# Patient Record
Sex: Female | Born: 1955 | Race: White | Hispanic: No | Marital: Married | Smoking: Former smoker
Health system: Southern US, Community
[De-identification: ages and names within clinical notes are randomized; demographics above are authoritative.]

## PROBLEM LIST (undated history)

## (undated) DIAGNOSIS — I10 Essential (primary) hypertension: Secondary | ICD-10-CM

## (undated) DIAGNOSIS — K219 Gastro-esophageal reflux disease without esophagitis: Secondary | ICD-10-CM

## (undated) DIAGNOSIS — F32A Depression, unspecified: Secondary | ICD-10-CM

## (undated) DIAGNOSIS — F329 Major depressive disorder, single episode, unspecified: Secondary | ICD-10-CM

## (undated) HISTORY — PX: ABLATION: SHX5711

---

## 2010-08-14 ENCOUNTER — Ambulatory Visit: Payer: 59 | Attending: Orthopedic Surgery | Admitting: Physical Therapy

## 2010-08-14 DIAGNOSIS — M542 Cervicalgia: Secondary | ICD-10-CM | POA: Insufficient documentation

## 2010-08-14 DIAGNOSIS — M546 Pain in thoracic spine: Secondary | ICD-10-CM | POA: Insufficient documentation

## 2010-08-14 DIAGNOSIS — IMO0001 Reserved for inherently not codable concepts without codable children: Secondary | ICD-10-CM | POA: Insufficient documentation

## 2010-08-14 DIAGNOSIS — R293 Abnormal posture: Secondary | ICD-10-CM | POA: Insufficient documentation

## 2010-08-17 ENCOUNTER — Ambulatory Visit: Payer: 59 | Admitting: Physical Therapy

## 2010-08-22 ENCOUNTER — Ambulatory Visit: Payer: 59 | Admitting: Physical Therapy

## 2010-08-24 ENCOUNTER — Ambulatory Visit: Payer: 59 | Attending: Orthopedic Surgery | Admitting: Physical Therapy

## 2010-08-24 DIAGNOSIS — M546 Pain in thoracic spine: Secondary | ICD-10-CM | POA: Insufficient documentation

## 2010-08-24 DIAGNOSIS — R293 Abnormal posture: Secondary | ICD-10-CM | POA: Insufficient documentation

## 2010-08-24 DIAGNOSIS — M542 Cervicalgia: Secondary | ICD-10-CM | POA: Insufficient documentation

## 2010-08-24 DIAGNOSIS — IMO0001 Reserved for inherently not codable concepts without codable children: Secondary | ICD-10-CM | POA: Insufficient documentation

## 2010-08-29 ENCOUNTER — Ambulatory Visit: Payer: 59 | Attending: Orthopedic Surgery | Admitting: Physical Therapy

## 2010-08-29 DIAGNOSIS — M546 Pain in thoracic spine: Secondary | ICD-10-CM | POA: Insufficient documentation

## 2010-08-29 DIAGNOSIS — IMO0001 Reserved for inherently not codable concepts without codable children: Secondary | ICD-10-CM | POA: Insufficient documentation

## 2010-08-29 DIAGNOSIS — R293 Abnormal posture: Secondary | ICD-10-CM | POA: Insufficient documentation

## 2010-08-29 DIAGNOSIS — M542 Cervicalgia: Secondary | ICD-10-CM | POA: Insufficient documentation

## 2010-08-31 ENCOUNTER — Encounter: Payer: 59 | Admitting: Physical Therapy

## 2014-02-24 ENCOUNTER — Emergency Department
Admission: EM | Admit: 2014-02-24 | Discharge: 2014-02-24 | Disposition: A | Discharge status: Home / Self Care | Payer: 59 | Source: Home / Self Care | Attending: Family Medicine | Admitting: Family Medicine

## 2014-02-24 ENCOUNTER — Encounter: Payer: Self-pay | Admitting: Emergency Medicine

## 2014-02-24 DIAGNOSIS — L02519 Cutaneous abscess of unspecified hand: Secondary | ICD-10-CM

## 2014-02-24 DIAGNOSIS — L03019 Cellulitis of unspecified finger: Secondary | ICD-10-CM

## 2014-02-24 DIAGNOSIS — L03011 Cellulitis of right finger: Secondary | ICD-10-CM

## 2014-02-24 HISTORY — DX: Depression, unspecified: F32.A

## 2014-02-24 HISTORY — DX: Major depressive disorder, single episode, unspecified: F32.9

## 2014-02-24 HISTORY — DX: Gastro-esophageal reflux disease without esophagitis: K21.9

## 2014-02-24 MED ORDER — SULFAMETHOXAZOLE-TRIMETHOPRIM 800-160 MG PO TABS: 2.0000 | ORAL_TABLET | Freq: Two times a day (BID) | ORAL | Status: DC

## 2014-02-24 MED ORDER — CEPHALEXIN 500 MG PO CAPS: 500.0000 mg | ORAL_CAPSULE | Freq: Four times a day (QID) | ORAL | Status: DC

## 2014-02-24 NOTE — ED Notes (Signed)
Pt c/o RT 3rd finger abscess x 2 days ago, worse today. She has applied warm compresses and neosporin with no relief.

## 2014-02-24 NOTE — ED Provider Notes (Signed)
Lindsey CarrowDebra Trujillo is a 58 y.o. female who presents to Urgent Care today for finger infection. Patient is a 2 day history of painful red bump on the dorsal aspect of her right third digit at the proximal phalanx. This started off as an itchy tiny bump that has worsened to becoming more painful and red. The redness is spreading. She denies any streaking distally or proximally. No pain with motion. No fevers or chills nausea vomiting or diarrhea. She's tried warm compresses and Neosporin which have not helped. She denies any injury.   Past Medical History  Diagnosis Date  . GERD (gastroesophageal reflux disease)   . Depression    History  Substance Use Topics  . Smoking status: Former Smoker    Quit date: 02/25/1984  . Smokeless tobacco: Not on file  . Alcohol Use: No   ROS as above Medications: No current facility-administered medications for this encounter.   Current Outpatient Prescriptions  Medication Sig Dispense Refill  . omeprazole (PRILOSEC) 20 MG capsule Take 20 mg by mouth daily.      . sertraline (ZOLOFT) 50 MG tablet Take 50 mg by mouth daily.      . cephALEXin (KEFLEX) 500 MG capsule Take 1 capsule (500 mg total) by mouth 4 (four) times daily.  28 capsule  0  . sulfamethoxazole-trimethoprim (SEPTRA DS) 800-160 MG per tablet Take 2 tablets by mouth 2 (two) times daily.  28 tablet  0    Exam:  BP 134/84  Pulse 83  Temp(Src) 98 F (36.7 C) (Oral)  Resp 16  Ht 5\' 6"  (1.676 m)  Wt 181 lb (82.101 kg)  BMI 29.23 kg/m2  SpO2 96% Gen: Well NAD Hand: Right third digit. Small tiny indurated papule at the dorsal aspect of the third digit at the proximal phalanx. Extending erythema present extending radially and ulnarly.   Motion is intact and normal. Pulses capillary Refill and sensation are intact.      No results found for this or any previous visit (from the past 24 hour(s)). No results found.  Assessment and Plan: 58 y.o. female with cellulitis. Not drainable abscess  currently. Treat with Keflex and Bactrim. Followup as needed.  Discussed warning signs or symptoms. Please see discharge instructions. Patient expresses understanding.     Rodolph BongEvan S Addysin Porco, MD 02/24/14 [phone]

## 2014-02-24 NOTE — Discharge Instructions (Signed)
Thank you for coming in today. Take both antibiotics starting this evening Return if not improving Go to the emergency room if worsening   Cellulitis Cellulitis is an infection of the skin and the tissue beneath it. The infected area is usually red and tender. Cellulitis occurs most often in the arms and lower legs.  CAUSES  Cellulitis is caused by bacteria that enter the skin through cracks or cuts in the skin. The most common types of bacteria that cause cellulitis are staphylococci and streptococci. SIGNS AND SYMPTOMS   Redness and warmth.  Swelling.  Tenderness or pain.  Fever. DIAGNOSIS  Your health care provider can usually determine what is wrong based on a physical exam. Blood tests may also be done. TREATMENT  Treatment usually involves taking an antibiotic medicine. HOME CARE INSTRUCTIONS   Take your antibiotic medicine as directed by your health care provider. Finish the antibiotic even if you start to feel better.  Keep the infected arm or leg elevated to reduce swelling.  Apply a warm cloth to the affected area up to 4 times per day to relieve pain.  Take medicines only as directed by your health care provider.  Keep all follow-up visits as directed by your health care provider. SEEK MEDICAL CARE IF:   You notice red streaks coming from the infected area.  Your red area gets larger or turns dark in color.  Your bone or joint underneath the infected area becomes painful after the skin has healed.  Your infection returns in the same area or another area.  You notice a swollen bump in the infected area.  You develop new symptoms.  You have a fever. SEEK IMMEDIATE MEDICAL CARE IF:   You feel very sleepy.  You develop vomiting or diarrhea.  You have a general ill feeling (malaise) with muscle aches and pains. MAKE SURE YOU:   Understand these instructions.  Will watch your condition.  Will get help right away if you are not doing well or get  worse. Document Released: 02/21/2005 Document Revised: 09/28/2013 Document Reviewed: 07/30/2011 [facility] Patient Information 2015 [place], [place]. This information is not intended to replace advice given to you by your health care provider. Make sure you discuss any questions you have with your health care provider.

## 2014-02-26 ENCOUNTER — Emergency Department (INDEPENDENT_AMBULATORY_CARE_PROVIDER_SITE_OTHER)
Admission: EM | Admit: 2014-02-26 | Discharge: 2014-02-26 | Disposition: A | Discharge status: Home / Self Care | Payer: 59 | Source: Home / Self Care | Attending: Emergency Medicine | Admitting: Emergency Medicine

## 2014-02-26 ENCOUNTER — Encounter: Payer: Self-pay | Admitting: Emergency Medicine

## 2014-02-26 DIAGNOSIS — L089 Local infection of the skin and subcutaneous tissue, unspecified: Secondary | ICD-10-CM

## 2014-02-26 DIAGNOSIS — S60429D Blister (nonthermal) of unspecified finger, subsequent encounter: Secondary | ICD-10-CM

## 2014-02-26 NOTE — Discharge Instructions (Signed)
Wound Infection °A wound infection happens when a type of germ (bacteria) starts growing in the wound. In some cases, this can cause the wound to break open. If cared for properly, the infected wound will heal from the inside to the outside. Wound infections need treatment. °CAUSES °An infection is caused by bacteria growing in the wound.  °SYMPTOMS  °· Increase in redness, swelling, or pain at the wound site. °· Increase in drainage at the wound site. °· Wound or bandage (dressing) starts to smell bad. °· Fever. °· Feeling tired or fatigued. °· Pus draining from the wound. °TREATMENT  °Your health care provider will prescribe antibiotic medicine. The wound infection should improve within 24 to 48 hours. Any redness around the wound should stop spreading and the wound should be less painful.  °HOME CARE INSTRUCTIONS  °· Only take over-the-counter or prescription medicines for pain, discomfort, or fever as directed by your health care provider. °· Take your antibiotics as directed. Finish them even if you start to feel better. °· Gently wash the area with mild soap and water 2 times a day, or as directed. Rinse off the soap. Pat the area dry with a clean towel. Do not rub the wound. This may cause bleeding. °· Follow your health care provider's instructions for how often you need to change the dressing. °· Apply ointment and a dressing to the wound as directed. °· If the dressing sticks, moisten it with soapy water and gently remove it. °· Change the bandage right away if it becomes wet, dirty, or develops a bad smell. °· Take showers. Do not take tub baths, swim, or do anything that may soak the wound until it is healed. °· Avoid exercises that make you sweat heavily. °· Use anti-itch medicine as directed by your health care provider. The wound may itch when it is healing. Do not pick or scratch at the wound. °· Follow up with your health care provider to get your wound rechecked as directed. °SEEK MEDICAL CARE  IF: °· You have an increase in swelling, pain, or redness around the wound. °· You have an increase in the amount of pus coming from the wound. °· There is a bad smell coming from the wound. °· More of the wound breaks open. °· You have a fever. °MAKE SURE YOU:  °· Understand these instructions. °· Will watch your condition. °· Will get help right away if you are not doing well or get worse. °Document Released: 02/10/2003 Document Revised: 05/19/2013 Document Reviewed: 09/17/2010 °ExitCare® Patient Information ©2015 [employer]. This information is not intended to replace advice given to you by your health care provider. Make sure you discuss any questions you have with your health care provider. ° °

## 2014-02-26 NOTE — ED Provider Notes (Signed)
CSN: [id number]     Arrival date & time 02/26/14  1205 History   First MD Initiated Contact with Patient 02/26/14 1231     Chief Complaint  Patient presents with  . Wound Check   (Consider location/radiation/quality/duration/timing/severity/associated sxs/prior Treatment) Patient is a 58 y.o. female presenting with wound check. The history is provided by the patient. No language interpreter was used.  Wound Check This is a new problem. The current episode started 2 days ago. The problem occurs constantly. The problem has been gradually worsening. Nothing aggravates the symptoms. Nothing relieves the symptoms. She has tried nothing for the symptoms.  Pt here for recheck of finger.  Pt reports she has increased swelling  Past Medical History  Diagnosis Date  . GERD (gastroesophageal reflux disease)   . Depression    Past Surgical History  Procedure Laterality Date  . Ablation     Family History  Problem Relation Age of Onset  . Diabetes Mother   . COPD Mother   . Emphysema Father    History  Substance Use Topics  . Smoking status: Former Smoker    Quit date: 02/25/1984  . Smokeless tobacco: Not on file  . Alcohol Use: No   OB History   Grav Para Term Preterm Abortions TAB SAB Ect Mult Living                 Review of Systems  All other systems reviewed and are negative.   Allergies  Review of patient's allergies indicates no known allergies.  Home Medications   Prior to Admission medications   Medication Sig Start Date End Date Taking? Authorizing Provider  cephALEXin (KEFLEX) 500 MG capsule Take 1 capsule (500 mg total) by mouth 4 (four) times daily. 02/24/14   Rodolph BongEvan S Corey, MD  omeprazole (PRILOSEC) 20 MG capsule Take 20 mg by mouth daily.    Historical Provider, MD  sertraline (ZOLOFT) 50 MG tablet Take 50 mg by mouth daily.    Historical Provider, MD  sulfamethoxazole-trimethoprim (SEPTRA DS) 800-160 MG per tablet Take 2 tablets by mouth 2 (two) times daily.  02/24/14   Rodolph BongEvan S Corey, MD   BP 170/94  Pulse 98  Temp(Src) 98.5 F (36.9 C) (Oral)  Resp 18  Ht 5\' 6"  (1.676 m)  Wt 180 lb (81.647 kg)  BMI 29.07 kg/m2  SpO2 96% Physical Exam  Nursing note and vitals reviewed. Constitutional: She is oriented to person, place, and time. She appears well-developed and well-nourished.  HENT:  Head: Normocephalic.  Eyes: EOM are normal.  Neck: Normal range of motion.  Abdominal: She exhibits no distension.  Musculoskeletal: She exhibits tenderness.  Swollen finger,  Redness at abscess site,  nv and ns intact  Neurological: She is alert and oriented to person, place, and time.  Psychiatric: She has a normal mood and affect.   No fluctuance or drainage ED Course  Procedures (including critical care time) Labs Review Labs Reviewed - No data to display  Imaging Review No results found.   MDM   1. Blister of finger, infected, subsequent encounter    Pt advised to elevate for next 24 hours.   Soak 4 times a day x 20 minutes,  Continue antibiotics Rechecked tomorrow   Elson AreasLeslie K Sofia, PA-C 02/26/14 1237  Medical history/examination/treatment/procedure(s) were performed by non-physician provider and as supervising physician I was immediately available for consultation/collaboration.  Lajean Manesavid Massey, MD 02/27/14 [phone]

## 2014-02-26 NOTE — ED Notes (Signed)
Wound recheck.  Seen here 2 days ago.  Patient states the swelling to finger is worse and more painful.

## 2014-02-27 ENCOUNTER — Emergency Department (INDEPENDENT_AMBULATORY_CARE_PROVIDER_SITE_OTHER)
Admission: EM | Admit: 2014-02-27 | Discharge: 2014-02-27 | Disposition: A | Discharge status: Home / Self Care | Payer: 59 | Source: Home / Self Care | Attending: Emergency Medicine | Admitting: Emergency Medicine

## 2014-02-27 ENCOUNTER — Encounter: Payer: Self-pay | Admitting: Emergency Medicine

## 2014-02-27 DIAGNOSIS — L02511 Cutaneous abscess of right hand: Secondary | ICD-10-CM

## 2014-02-27 DIAGNOSIS — IMO0001 Reserved for inherently not codable concepts without codable children: Secondary | ICD-10-CM

## 2014-02-27 MED ORDER — HYDROCODONE-ACETAMINOPHEN 5-325 MG PO TABS: 1.0000 | ORAL_TABLET | ORAL | Status: DC | PRN

## 2014-02-27 MED ORDER — DOXYCYCLINE HYCLATE 100 MG PO CAPS: 100.0000 mg | ORAL_CAPSULE | Freq: Two times a day (BID) | ORAL | Status: DC

## 2014-02-27 MED ORDER — CEFTRIAXONE SODIUM 1 G IJ SOLR
1.0000 g | INTRAMUSCULAR | Status: AC
  Administered 2014-02-27: 1 g via INTRAMUSCULAR

## 2014-02-27 NOTE — ED Provider Notes (Signed)
CSN: [id number]     Arrival date & time 02/27/14  0902 History   First MD Initiated Contact with Patient 02/27/14 [id number]     Chief Complaint  Patient presents with  . Hand Pain    HPI Here for follow-up soft tissue infection R middle finger. Seen here by Dr Denyse Amassorey on Wed. 9/30 for first time, for cellulitis, without any drainable lesion, treated appropriately with Keflex and Septra. Followed up here yesterday, seen by another provider, again without any drainable lesion, and treatment was continued with the addition of warm soaks. Patient presents today with 1 day of worsening swelling and pain and redness right third finger. Pain is sharp and dull and throbbing, 8/10 at rest and 10 of 10 to palpation. No definite fever or chills. No drainage. Denies numbness or weakness.  Past Medical History  Diagnosis Date  . GERD (gastroesophageal reflux disease)   . Depression    Past Surgical History  Procedure Laterality Date  . Ablation     Family History  Problem Relation Age of Onset  . Diabetes Mother   . COPD Mother   . Emphysema Father    History  Substance Use Topics  . Smoking status: Former Smoker    Quit date: 02/25/1984  . Smokeless tobacco: Not on file  . Alcohol Use: No   OB History   Grav Para Term Preterm Abortions TAB SAB Ect Mult Living                 Review of Systems  All other systems reviewed and are negative.   Allergies  Review of patient's allergies indicates no known allergies.  Home Medications   Prior to Admission medications   Medication Sig Start Date End Date Taking? Authorizing Provider  cephALEXin (KEFLEX) 500 MG capsule Take 1 capsule (500 mg total) by mouth 4 (four) times daily. 02/24/14   Rodolph BongEvan S Corey, MD  doxycycline (VIBRAMYCIN) 100 MG capsule Take 1 capsule (100 mg total) by mouth 2 (two) times daily. For 10 days 02/27/14   Lajean Manesavid Massey, MD  HYDROcodone-acetaminophen (NORCO/VICODIN) 5-325 MG per tablet Take 1-2 tablets by mouth every 4  (four) hours as needed for severe pain. Take with food. 02/27/14   Lajean Manesavid Massey, MD  omeprazole (PRILOSEC) 20 MG capsule Take 20 mg by mouth daily.    Historical Provider, MD  sertraline (ZOLOFT) 50 MG tablet Take 50 mg by mouth daily.    Historical Provider, MD   BP 143/97  Pulse 98  Temp(Src) 98.3 F (36.8 C) (Oral)  Ht 5\' 6"  (1.676 m)  Wt 178 lb 4 oz (80.854 kg)  BMI 28.78 kg/m2  SpO2 99% Physical Exam  Nursing note and vitals reviewed. Constitutional: She is oriented to person, place, and time. She appears well-developed and well-nourished. No distress.  Pleasant, cooperative female. Very uncomfortable from right third finger pain.  HENT:  Head: Normocephalic and atraumatic.  Eyes: Conjunctivae and EOM are normal. Pupils are equal, round, and reactive to light. No scleral icterus.  Neck: Normal range of motion.  Cardiovascular: Normal rate.   Pulmonary/Chest: Effort normal.  Abdominal: She exhibits no distension.  Musculoskeletal: Normal range of motion.       Right hand: She exhibits swelling. She exhibits normal capillary refill. Normal sensation noted. Normal strength noted.       Hands: As depicted, on dorsum of R third finger, proximal phalanx, 6 x 6 mm area of fluctuance in center surrounded by 15 x 15 mm area of  redness and heat and exquisite tenderness.  Neurological: She is alert and oriented to person, place, and time.  Skin: Skin is warm.  Psychiatric: She has a normal mood and affect.    ED Course  INCISION AND DRAINAGE Date/Time: 02/27/2014 9:16 AM Performed by: Georgina Pillion, DAVID Authorized by: Georgina Pillion, DAVID Consent: Verbal consent obtained. Risks and benefits: risks, benefits and alternatives were discussed Consent given by: patient Patient understanding: patient states understanding of the procedure being performed Patient identity confirmed: verbally with patient Time out: Immediately prior to procedure a "time out" was called to verify the correct patient,  procedure, equipment, support staff and site/side marked as required. Type: abscess Location: right third finger. Anesthesia method: after options discussed, including local or digital block, patient declined anesthesia. Scalpel size: 11 Incision type: single straight Complexity: simple Drainage: purulent and  serosanguinous Drainage amount: moderate Wound treatment: wound left open Patient tolerance: Patient tolerated the procedure well with no immediate complications. Comments: Sterile technique, prepped in the usual fashion. I&D with # 11 blade, pus sent for culture,and almost immediately, she reported much less pain right third finger. Dressed with nonstick dressing and cause. Wound care discussed in AVS. Advised to follow-up tomorrow for recheck.  postprocedure, range of motion and tendons were tested and were intact. Labs Review Labs Reviewed  WOUND CULTURE    Imaging Review No results found.   MDM   1. Abscess of third finger of right hand    Incision and drainage as described above. Wound culture sent. Rocephin 1 g IM stat Discontinue Septra, and instead start doxycycline for staph coverage. Continue cephalexin. Vicodin prescribed if needed for severe postop pain. Follow-up: Tomorrow to recheck. She understands that wound culture may take two days for results. See AVS for further details.  Precautions discussed. Red flags discussed. Questions invited and answered. Patient voiced understanding and agreement.     Lajean Manes, MD 02/27/14 2000

## 2014-02-27 NOTE — Discharge Instructions (Signed)
Abscess Care After An abscess (also called a boil or furuncle) is an infected area that contains a collection of pus. Signs and symptoms of an abscess include pain, tenderness, redness, or hardness, or you may feel a moveable soft area under your skin. An abscess can occur anywhere in the body. The infection may spread to surrounding tissues causing cellulitis. A cut (incision) by the surgeon was made over your abscess and the pus was drained out. Gauze may have been packed into the space to provide a drain that will allow the cavity to heal from the inside outwards. The boil may be painful for 5 to 7 days. Most people with a boil do not have high fevers. HOME CARE INSTRUCTIONS   Only take over-the-counter or prescription medicines for pain, discomfort, or fever as directed by your caregiver.--Tylenol or ibuprofen for mild to moderate pain. Prescription for Vicodin if needed for severe pain.  Soak warm water and then remove gauze 3 times a day . You may then wash the wound gently with mild soapy water. Re -dress with gauze or do as your caregiver directs. SEEK IMMEDIATE MEDICAL CARE IF:   You develop increased pain, swelling, redness, drainage, or bleeding in the wound site.  You develop signs of generalized infection including muscle aches, chills, fever, or a general ill feeling.  An oral temperature above 102 F (38.9 C) develops, not controlled by medication. See your caregiver for a recheck if you develop any of the symptoms described above. If medications (antibiotics) were prescribed, take them as directed.- Stop the Septra (sulfamethoxazole trimethoprim).  Continue the cephalexin antibiotic. Add a new antibiotic: Doxycycline twice a day.  Today, we also gave you a shot of Rocephin which is a strong antibiotic.  Today,we obtained a swab of the wound, sending off for culture, to try to identify the bacteria causing the infection. This may take 2 days to get the results.  Keep right  hand elevated, and don't do any work with right hand until rechecked tomorrow.

## 2014-02-27 NOTE — ED Notes (Signed)
Lesion on R middle finger. Seen on Wed. 9/30 for first time with a f/u 10/2

## 2014-02-28 ENCOUNTER — Encounter: Payer: Self-pay | Admitting: Emergency Medicine

## 2014-02-28 ENCOUNTER — Emergency Department (INDEPENDENT_AMBULATORY_CARE_PROVIDER_SITE_OTHER)
Admission: EM | Admit: 2014-02-28 | Discharge: 2014-02-28 | Disposition: A | Discharge status: Home / Self Care | Payer: 59 | Source: Home / Self Care | Attending: Family Medicine | Admitting: Family Medicine

## 2014-02-28 DIAGNOSIS — L02511 Cutaneous abscess of right hand: Secondary | ICD-10-CM

## 2014-02-28 NOTE — ED Notes (Signed)
In for recheck of lesion on R middle finger.

## 2014-02-28 NOTE — ED Provider Notes (Signed)
Lindsey Trujillo is a 58 y.o. female who presents to Urgent Care today for followup of cellulitis. Patient was seen several days ago for an infection or abscess involving the right third digit. She was started on Bactrim and Keflex. The wound worsened and she followed up yesterday and had an incision and drainage as well as abscess. Her Bactrim was changed to doxycycline and she was given no muscular injection of ceftriaxone. She continues to take Keflex and doxycycline currently. She has the pain is much less than it was yesterday. No fevers or chills nausea vomiting or diarrhea.   Past Medical History  Diagnosis Date  . GERD (gastroesophageal reflux disease)   . Depression    History  Substance Use Topics  . Smoking status: Former Smoker    Quit date: 02/25/1984  . Smokeless tobacco: Not on file  . Alcohol Use: No   ROS as above Medications: No current facility-administered medications for this encounter.   Current Outpatient Prescriptions  Medication Sig Dispense Refill  . cephALEXin (KEFLEX) 500 MG capsule Take 1 capsule (500 mg total) by mouth 4 (four) times daily.  28 capsule  0  . doxycycline (VIBRAMYCIN) 100 MG capsule Take 1 capsule (100 mg total) by mouth 2 (two) times daily. For 10 days  20 capsule  0  . HYDROcodone-acetaminophen (NORCO/VICODIN) 5-325 MG per tablet Take 1-2 tablets by mouth every 4 (four) hours as needed for severe pain. Take with food.  12 tablet  0  . omeprazole (PRILOSEC) 20 MG capsule Take 20 mg by mouth daily.      . sertraline (ZOLOFT) 50 MG tablet Take 50 mg by mouth daily.        Exam:  BP 133/80  Pulse 102  Temp(Src) 97.4 F (36.3 C) (Oral)  Ht 5\' 6"  (1.676 m)  Wt 179 lb 12 oz (81.534 kg)  BMI 29.03 kg/m2  SpO2 99% Gen: Well NAD Skin:  Localized 1 cm area of erythema with mild tenderness. No expressible pus. No extending erythema. Hand motion is normal and intact. Pulses capillary refill and sensation are intact     No results found for  this or any previous visit (from the past 24 hour(s)). No results found.  Assessment and Plan: 58 y.o. female with cellulitis and abscess. Seems to be improved compared to yesterday. Continue antibiotics. Continue warm compresses and soaks. Return to clinic in 2 days for wound recheck. Return sooner if needed.  Discussed warning signs or symptoms. Please see discharge instructions. Patient expresses understanding.     Rodolph BongEvan S Angenette Daily, MD 02/28/14 [phone]

## 2014-02-28 NOTE — Discharge Instructions (Signed)
Thank you for coming in today. Continue keflex and doxycycline.  Come back in 2 days for wound recheck.    Cellulitis Cellulitis is an infection of the skin and the tissue beneath it. The infected area is usually red and tender. Cellulitis occurs most often in the arms and lower legs.  CAUSES  Cellulitis is caused by bacteria that enter the skin through cracks or cuts in the skin. The most common types of bacteria that cause cellulitis are staphylococci and streptococci. SIGNS AND SYMPTOMS   Redness and warmth.  Swelling.  Tenderness or pain.  Fever. DIAGNOSIS  Your health care provider can usually determine what is wrong based on a physical exam. Blood tests may also be done. TREATMENT  Treatment usually involves taking an antibiotic medicine. HOME CARE INSTRUCTIONS   Take your antibiotic medicine as directed by your health care provider. Finish the antibiotic even if you start to feel better.  Keep the infected arm or leg elevated to reduce swelling.  Apply a warm cloth to the affected area up to 4 times per day to relieve pain.  Take medicines only as directed by your health care provider.  Keep all follow-up visits as directed by your health care provider. SEEK MEDICAL CARE IF:   You notice red streaks coming from the infected area.  Your red area gets larger or turns dark in color.  Your bone or joint underneath the infected area becomes painful after the skin has healed.  Your infection returns in the same area or another area.  You notice a swollen bump in the infected area.  You develop new symptoms.  You have a fever. SEEK IMMEDIATE MEDICAL CARE IF:   You feel very sleepy.  You develop vomiting or diarrhea.  You have a general ill feeling (malaise) with muscle aches and pains. MAKE SURE YOU:   Understand these instructions.  Will watch your condition.  Will get help right away if you are not doing well or get worse. Document Released: 02/21/2005  Document Revised: 09/28/2013 Document Reviewed: 07/30/2011 [facility] Patient Information 2015 [place]Care, [place]LL[place]. This information is not intended to replace advice given to you by your health care provider. Make sure you discuss any questions you have with your health care provider.

## 2014-03-02 ENCOUNTER — Encounter: Payer: Self-pay | Admitting: Emergency Medicine

## 2014-03-02 ENCOUNTER — Emergency Department (INDEPENDENT_AMBULATORY_CARE_PROVIDER_SITE_OTHER)
Admission: EM | Admit: 2014-03-02 | Discharge: 2014-03-02 | Disposition: A | Discharge status: Home / Self Care | Payer: 59 | Source: Home / Self Care

## 2014-03-02 ENCOUNTER — Telehealth: Payer: Self-pay | Admitting: Emergency Medicine

## 2014-03-02 DIAGNOSIS — L02511 Cutaneous abscess of right hand: Secondary | ICD-10-CM

## 2014-03-02 LAB — WOUND CULTURE
Gram Stain: NONE SEEN
Gram Stain: NONE SEEN
Gram Stain: NONE SEEN

## 2014-03-02 NOTE — ED Provider Notes (Signed)
CSN: [id number]     Arrival date & time 03/02/14  0806 History   None    Chief Complaint  Patient presents with  . Wound Check   (Consider location/radiation/quality/duration/timing/severity/associated sxs/prior Treatment) HPI Pt presents to the clinic for wound recheck. She has been treating what first was a bump on hand that turned into abcess. She is currently on doxycycline and keflex. There is little to no drainage present today. There is also little to no pain as well. No fever, chill, n/v. She is ready to go back to work she feels like she can type again. She is keeping wound covered.   Past Medical History  Diagnosis Date  . GERD (gastroesophageal reflux disease)   . Depression    Past Surgical History  Procedure Laterality Date  . Ablation     Family History  Problem Relation Age of Onset  . Diabetes Mother   . COPD Mother   . Emphysema Father    History  Substance Use Topics  . Smoking status: Former Smoker    Quit date: 02/25/1984  . Smokeless tobacco: Not on file  . Alcohol Use: No   OB History   Grav Para Term Preterm Abortions TAB SAB Ect Mult Living                 Review of Systems  All other systems reviewed and are negative.   Allergies  Review of patient's allergies indicates no known allergies.  Home Medications   Prior to Admission medications   Medication Sig Start Date End Date Taking? Authorizing Provider  cephALEXin (KEFLEX) 500 MG capsule Take 1 capsule (500 mg total) by mouth 4 (four) times daily. 02/24/14   Rodolph BongEvan S Corey, MD  doxycycline (VIBRAMYCIN) 100 MG capsule Take 1 capsule (100 mg total) by mouth 2 (two) times daily. For 10 days 02/27/14   Lajean Manesavid Massey, MD  HYDROcodone-acetaminophen (NORCO/VICODIN) 5-325 MG per tablet Take 1-2 tablets by mouth every 4 (four) hours as needed for severe pain. Take with food. 02/27/14   Lajean Manesavid Massey, MD  omeprazole (PRILOSEC) 20 MG capsule Take 20 mg by mouth daily.    Historical Provider, MD   sertraline (ZOLOFT) 50 MG tablet Take 50 mg by mouth daily.    Historical Provider, MD   BP 151/85  Pulse 85  Temp(Src) 98.7 F (37.1 C) (Oral)  Resp 16  SpO2 94% Physical Exam  Skin:  Right middle finger abscess incision is well healed with no active drainage. There continues to be approximately 2.5cm by 2.25cm of erythema total diameter around the abcess. No tenderness. Pt does not feel it has increased since last visit.     ED Course  Procedures (including critical care time) Labs Review Labs Reviewed - No data to display  Imaging Review No results found.   MDM   1. Abscess of finger of right hand    Wound seems to be healing and patient feeling better.  Finish doxycycline and keflex.  Still waiting for culture results.  Pt has no pain.  Concerned redness is not going down but pt admits to just finishing warm compress. Discussed she could stop that today and since wound closed and not draining consider cool compresses for erythema.  No swelling noted. Pt feels like she can type and do her job.  Allowed her to go back to work as long as she keep wound covered.  Discussed to measure same points as I measured today for next week. If redness increasing  then needs to call or come back in for follow up.     Jomarie Longs, PA-C 03/02/14 [phone]

## 2014-03-02 NOTE — ED Notes (Signed)
New dressing applied using non stick gauze pad secured with 1 inch coban.

## 2014-03-02 NOTE — ED Notes (Signed)
Wound recheck.  Patient states that she feels better.  Did not take pain medication this AM. Reports pain level 1/10.

## 2014-03-02 NOTE — ED Notes (Signed)
Here for wound recheck right middle finger.   

## 2014-03-18 NOTE — ED Provider Notes (Signed)
Agree with exam, assessment, and plan.   Stephen A Beese, MD 03/18/14 1832 

## 2014-10-05 DIAGNOSIS — I10 Essential (primary) hypertension: Secondary | ICD-10-CM | POA: Insufficient documentation

## 2015-02-19 ENCOUNTER — Encounter: Payer: Self-pay | Admitting: Emergency Medicine

## 2015-02-19 ENCOUNTER — Emergency Department
Admission: EM | Admit: 2015-02-19 | Discharge: 2015-02-19 | Disposition: A | Discharge status: Home / Self Care | Payer: 59 | Source: Home / Self Care | Attending: Family Medicine | Admitting: Family Medicine

## 2015-02-19 DIAGNOSIS — R05 Cough: Secondary | ICD-10-CM | POA: Diagnosis not present

## 2015-02-19 DIAGNOSIS — R0981 Nasal congestion: Secondary | ICD-10-CM | POA: Diagnosis not present

## 2015-02-19 DIAGNOSIS — R059 Cough, unspecified: Secondary | ICD-10-CM

## 2015-02-19 DIAGNOSIS — J029 Acute pharyngitis, unspecified: Secondary | ICD-10-CM

## 2015-02-19 MED ORDER — SALINE SPRAY 0.65 % NA SOLN: 1.0000 | NASAL | Status: DC | PRN

## 2015-02-19 MED ORDER — BENZONATATE 100 MG PO CAPS: 100.0000 mg | ORAL_CAPSULE | Freq: Three times a day (TID) | ORAL | Status: DC

## 2015-02-19 MED ORDER — CETIRIZINE HCL 10 MG PO TABS: 10.0000 mg | ORAL_TABLET | Freq: Every day | ORAL | Status: DC

## 2015-02-19 MED ORDER — DM-GUAIFENESIN ER 30-600 MG PO TB12: 1.0000 | ORAL_TABLET | Freq: Two times a day (BID) | ORAL | Status: DC

## 2015-02-19 NOTE — ED Provider Notes (Signed)
CSN: [id number]     Arrival date & time 02/19/15  0906 History   First MD Initiated Contact with Patient 02/19/15 [phone]     Chief Complaint  Patient presents with  . Cough   (Consider location/radiation/quality/duration/timing/severity/associated sxs/prior Treatment) HPI  Pt is a 59yo female presenting to [facility] with c/o mild intermittent dry to productive cough with small amount of yellow sputum for 5 days, associated sneezing with nasal congestion and sore throat.  She has been using Flonase which she has been using daily for several years.  She reports her granddaughter came over last week and has a "hacking cough" but pt believed it was from allergies, her granddaughter now as a URI.  Pt denies chest pain, SOB, n/v/d. Denies recent travel. She has not taken any cough medications.  Denies hx of asthma. Denies leg pain or swelling.  Past Medical History  Diagnosis Date  . GERD (gastroesophageal reflux disease)   . Depression    Past Surgical History  Procedure Laterality Date  . Ablation     Family History  Problem Relation Age of Onset  . Diabetes Mother   . COPD Mother   . Emphysema Father    Social History  Substance Use Topics  . Smoking status: Former Smoker    Quit date: 02/25/1984  . Smokeless tobacco: None  . Alcohol Use: No   OB History    No data available     Review of Systems  Constitutional: Negative for fever and chills.  HENT: Positive for congestion, sneezing and sore throat. Negative for ear pain, trouble swallowing and voice change.   Respiratory: Positive for cough. Negative for shortness of breath.   Cardiovascular: Negative for chest pain and palpitations.  Gastrointestinal: Negative for nausea, vomiting, abdominal pain and diarrhea.  Musculoskeletal: Negative for myalgias, back pain and arthralgias.  Skin: Negative for rash.  All other systems reviewed and are negative.   Allergies  Review of patient's allergies indicates no known  allergies.  Home Medications   Prior to Admission medications   Medication Sig Start Date End Date Taking? Authorizing Provider  benzonatate (TESSALON) 100 MG capsule Take 1 capsule (100 mg total) by mouth every 8 (eight) hours. 02/19/15   Junius Finner, PA-C  cephALEXin (KEFLEX) 500 MG capsule Take 1 capsule (500 mg total) by mouth 4 (four) times daily. 02/24/14   Rodolph Bong, MD  cetirizine (ZYRTEC) 10 MG tablet Take 1 tablet (10 mg total) by mouth daily. 02/19/15   Junius Finner, PA-C  dextromethorphan-guaiFENesin (MUCINEX DM) 30-600 MG per 12 hr tablet Take 1 tablet by mouth 2 (two) times daily. With glass of water 02/19/15   Junius Finner, PA-C  doxycycline (VIBRAMYCIN) 100 MG capsule Take 1 capsule (100 mg total) by mouth 2 (two) times daily. For 10 days 02/27/14   Lajean Manes, MD  HYDROcodone-acetaminophen (NORCO/VICODIN) 5-325 MG per tablet Take 1-2 tablets by mouth every 4 (four) hours as needed for severe pain. Take with food. 02/27/14   Lajean Manes, MD  omeprazole (PRILOSEC) 20 MG capsule Take 20 mg by mouth daily.    Historical Provider, MD  sertraline (ZOLOFT) 50 MG tablet Take 50 mg by mouth daily.    Historical Provider, MD  sodium chloride (OCEAN) 0.65 % SOLN nasal spray Place 1 spray into both nostrils as needed. 02/19/15   Junius Finner, PA-C   Meds Ordered and Administered this Visit  Medications - No data to display  BP 121/81 mmHg  Pulse 100  Temp(Src)  98.4 F (36.9 C) (Oral)  Resp 16  Ht  (1.651 m)  Wt 179 lb (81.194 kg)  BMI 29.79 kg/m2  SpO2 96% No data found.   Physical Exam  Constitutional: She appears well-developed and well-nourished. No distress.  HENT:  Head: Normocephalic and atraumatic.  Right Ear: Hearing, tympanic membrane, external ear and ear canal normal.  Left Ear: Hearing, tympanic membrane, external ear and ear canal normal.  Nose: Mucosal edema present. Epistaxis (dried red blood in Right nare) is observed. Right sinus exhibits no  maxillary sinus tenderness and no frontal sinus tenderness. Left sinus exhibits no maxillary sinus tenderness and no frontal sinus tenderness.  Mouth/Throat: Uvula is midline, oropharynx is clear and moist and mucous membranes are normal.  Eyes: Conjunctivae are normal. No scleral icterus.  Neck: Normal range of motion. Neck supple.  Cardiovascular: Regular rhythm and normal heart sounds.  Tachycardia present.   mild  Pulmonary/Chest: Effort normal and breath sounds normal. No respiratory distress. She has no wheezes. She has no rales. She exhibits no tenderness.  Abdominal: Soft. She exhibits no distension and no mass. There is no tenderness. There is no rebound and no guarding.  Musculoskeletal: Normal range of motion.  Neurological: She is alert.  Skin: Skin is warm and dry. She is not diaphoretic.  Nursing note and vitals reviewed.   ED Course  Procedures (including critical care time)  Labs Review Labs Reviewed - No data to display  Imaging Review No results found.   MDM   1. Cough   2. Nasal congestion   3. Sore throat     Pt c/o cough, congestion, and sore throat. Contact with granddaughter with similar symptoms 1 week ago. Pt appears well, non-toxic, no respiratory distress. HR slightly elevated at 112 in triage, improved to 100 upon recheck.  Pt has no risk factors for DVT. Doubt PE at this time. Symptoms and hx most c/w URI. Discussed CXR or conservative treatment, pt agreed to conservative treatment and will f/u with PCP if not improving. Rx: tessalon, mucinex DM, nasal saline (pt has hx of recurrent nosebleeds, has been seen by ENT for same, Right nare appeared dried and irritated today), and cetirizine. Home care instructions provide.   F/u with PCP in 4-5 days if not improving, sooner if worsening. Patient verbalized understanding and agreement with treatment plan.     Junius Finner, PA-C 02/19/15 [phone]

## 2015-02-19 NOTE — ED Notes (Signed)
Patient present to [facility] with C/O dry cough with increase sneezing times five days. She states productive at times with small amount of yellow sputum. Denies pain, fever or SOB. NAD noted at this time

## 2015-02-19 NOTE — ED Notes (Signed)
Discharge with instructions patient verbalizes understanding.

## 2015-06-05 ENCOUNTER — Encounter: Payer: Self-pay | Admitting: Emergency Medicine

## 2015-06-05 ENCOUNTER — Emergency Department
Admission: EM | Admit: 2015-06-05 | Discharge: 2015-06-05 | Disposition: A | Discharge status: Home / Self Care | Payer: 59 | Source: Home / Self Care | Attending: Family Medicine | Admitting: Family Medicine

## 2015-06-05 DIAGNOSIS — B9789 Other viral agents as the cause of diseases classified elsewhere: Principal | ICD-10-CM

## 2015-06-05 DIAGNOSIS — J069 Acute upper respiratory infection, unspecified: Secondary | ICD-10-CM

## 2015-06-05 MED ORDER — DOXYCYCLINE HYCLATE 100 MG PO CAPS: 100.0000 mg | ORAL_CAPSULE | Freq: Two times a day (BID) | ORAL | Status: DC

## 2015-06-05 MED ORDER — GUAIFENESIN-CODEINE 100-10 MG/5ML PO SOLN: ORAL | Status: DC

## 2015-06-05 NOTE — ED Provider Notes (Signed)
CSN: [id number]     Arrival date & time 06/05/15  1540 History   First MD Initiated Contact with Patient 06/05/15 1718     Chief Complaint  Patient presents with  . Cough      HPI Comments: Patient complains of four day history of typical cold-like symptoms including mild sore throat, sinus congestion, headache, fatigue, and cough.  No fevers, chills, and sweats   The history is provided by the patient.    Past Medical History  Diagnosis Date  . GERD (gastroesophageal reflux disease)   . Depression    Past Surgical History  Procedure Laterality Date  . Ablation     Family History  Problem Relation Age of Onset  . Diabetes Mother   . COPD Mother   . Emphysema Father    Social History  Substance Use Topics  . Smoking status: Former Smoker    Quit date: 02/25/1984  . Smokeless tobacco: None  . Alcohol Use: No   OB History    No data available     Review of Systems + sore throat + cough No pleuritic pain No wheezing + nasal congestion + post-nasal drainage No sinus pain/pressure No itchy/red eyes No earache No hemoptysis No SOB No fever/chills No nausea No vomiting No abdominal pain No diarrhea No urinary symptoms No skin rash + fatigue No myalgias + headache Used OTC meds without relief  Allergies  Review of patient's allergies indicates no known allergies.  Home Medications   Prior to Admission medications   Medication Sig Start Date End Date Taking? Authorizing Provider  Escitalopram Oxalate (LEXAPRO PO) Take by mouth.   Yes Historical Provider, MD  RaNITidine HCl (ZANTAC PO) Take by mouth.   Yes Historical Provider, MD  doxycycline (VIBRAMYCIN) 100 MG capsule Take 1 capsule (100 mg total) by mouth 2 (two) times daily. Take with food (Rx void after 06/13/15) 06/05/15   Lattie HawStephen A Beese, MD  guaiFENesin-codeine 100-10 MG/5ML syrup Take 10mL by mouth at bedtime as needed for cough 06/05/15   Lattie HawStephen A Beese, MD   Meds Ordered and Administered this Visit   Medications - No data to display  BP 119/75 mmHg  Temp(Src) 98.1 F (36.7 C) (Oral)  Ht 5\' 5"  (1.651 m)  Wt 183 lb 4 oz (83.122 kg)  BMI 30.49 kg/m2  SpO2 96% No data found.   Physical Exam Nursing notes and Vital Signs reviewed. Appearance:  Patient appears stated age, and in no acute distress.  Patient is obese (BMI 30.5) Eyes:  Pupils are equal, round, and reactive to light and accomodation.  Extraocular movement is intact.  Conjunctivae are not inflamed  Ears:  Canals normal.  Tympanic membranes normal.  Nose:  Mildly congested turbinates.  No sinus tenderness.  Pharynx:  Normal Neck:  Supple.  Tender enlarged posterior nodes are palpated bilaterally  Lungs:  Clear to auscultation.  Breath sounds are equal.  Moving air well. Heart:  Regular rate and rhythm without murmurs, rubs, or gallops.  Abdomen:  Nontender without masses or hepatosplenomegaly.  Bowel sounds are present.  No CVA or flank tenderness.  Extremities:  No edema.  Skin:  No rash present.   ED Course  Procedures  None   MDM   1. Viral URI with cough    There is no evidence of bacterial infection today.  Treat symptomatically for now  Rx for Robitussin AC for night time cough.  Take plain guaifenesin (1200mg  extended release tabs such as Mucinex) twice daily,  with plenty of water, for cough and congestion. Get adequate rest.   May use Afrin nasal spray (or generic oxymetazoline) twice daily for about 5 days and then discontinue.  Also recommend using saline nasal spray several times daily and saline nasal irrigation (AYR is a common brand).  Use Flonase nasal spray each morning after using Afrin nasal spray and saline nasal irrigation. Try warm salt water gargles for sore throat.  Stop all antihistamines for now, and other non-prescription cough/cold preparations. Begin doxycycline if not improving about one week or if persistent fever develops (Given a prescription to hold, with an expiration date)   Follow-up with family doctor if not improving about10 days.     Lattie Haw, MD 06/07/15 [phone]

## 2015-06-05 NOTE — Discharge Instructions (Signed)
Take plain guaifenesin (1200mg extended release tabs such as Mucinex) twice daily, with plenty of water, for cough and congestion. Get adequate rest.   °May use Afrin nasal spray (or generic oxymetazoline) twice daily for about 5 days and then discontinue.  Also recommend using saline nasal spray several times daily and saline nasal irrigation (AYR is a common brand).  Use Flonase nasal spray each morning after using Afrin nasal spray and saline nasal irrigation. °Try warm salt water gargles for sore throat.  °Stop all antihistamines for now, and other non-prescription cough/cold preparations. °Begin doxycycline if not improving about one week or if persistent fever develops. °Follow-up with family doctor if not improving about10 days.  °

## 2015-06-05 NOTE — ED Notes (Signed)
Pt c/o cough x 1 week, productive cough, runny nose

## 2016-04-08 ENCOUNTER — Emergency Department (INDEPENDENT_AMBULATORY_CARE_PROVIDER_SITE_OTHER): Payer: 59

## 2016-04-08 ENCOUNTER — Emergency Department
Admission: EM | Admit: 2016-04-08 | Discharge: 2016-04-08 | Disposition: A | Discharge status: Home / Self Care | Payer: 59 | Source: Home / Self Care | Attending: Family Medicine | Admitting: Family Medicine

## 2016-04-08 ENCOUNTER — Encounter: Payer: Self-pay | Admitting: Emergency Medicine

## 2016-04-08 DIAGNOSIS — M25512 Pain in left shoulder: Secondary | ICD-10-CM

## 2016-04-08 DIAGNOSIS — S20212A Contusion of left front wall of thorax, initial encounter: Secondary | ICD-10-CM

## 2016-04-08 DIAGNOSIS — S5002XA Contusion of left elbow, initial encounter: Secondary | ICD-10-CM

## 2016-04-08 HISTORY — DX: Essential (primary) hypertension: I10
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
## 2016-04-08 NOTE — Discharge Instructions (Signed)
Apply ice pack for 20 to 30 minutes, 3 to 4 times daily  Continue until pain and swelling decrease.  May take Ibuprofen 200mg , 4 tabs every 8 hours with food.  Begin elbow range of motion and stretching exercises as tolerated.
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
## 2016-05-08 DIAGNOSIS — N029 Recurrent and persistent hematuria with unspecified morphologic changes: Secondary | ICD-10-CM | POA: Insufficient documentation

## 2017-01-09 ENCOUNTER — Emergency Department (INDEPENDENT_AMBULATORY_CARE_PROVIDER_SITE_OTHER)
Admission: EM | Admit: 2017-01-09 | Discharge: 2017-01-09 | Disposition: A | Discharge status: Home / Self Care | Payer: 59 | Source: Home / Self Care | Attending: Family Medicine | Admitting: Family Medicine

## 2017-01-09 ENCOUNTER — Encounter: Payer: Self-pay | Admitting: *Deleted

## 2017-01-09 DIAGNOSIS — Z23 Encounter for immunization: Secondary | ICD-10-CM | POA: Diagnosis not present

## 2017-01-09 DIAGNOSIS — S61210A Laceration without foreign body of right index finger without damage to nail, initial encounter: Secondary | ICD-10-CM | POA: Diagnosis not present

## 2017-01-09 MED ORDER — TETANUS-DIPHTH-ACELL PERTUSSIS 5-2.5-18.5 LF-MCG/0.5 IM SUSP
0.5000 mL | Freq: Once | INTRAMUSCULAR | Status: AC
  Administered 2017-01-09: 0.5 mL via INTRAMUSCULAR

## 2017-01-09 MED ORDER — ACETAMINOPHEN 325 MG PO TABS
650.0000 mg | ORAL_TABLET | Freq: Once | ORAL | Status: AC
  Administered 2017-01-09: 650 mg via ORAL

## 2017-01-09 NOTE — Discharge Instructions (Signed)
°  You may take 500mg acetaminophen every 4-6 hours or in combination with ibuprofen 400-600mg every 6-8 hours as needed for pain and swelling. ° °

## 2017-01-09 NOTE — ED Triage Notes (Signed)
Pt c/o RT 2nd finger laceration at 1700 today. She reports cutting it on a string attached to a bucket that she dropped causing the laceration.

## 2017-01-09 NOTE — ED Provider Notes (Signed)
[facility]    CSN: [id number] Arrival date & time: 01/09/17  1702     History   Chief Complaint Chief Complaint  Patient presents with  . Laceration    HPI Lindsey Trujillo is a 61 y.o. female.   HPI Lindsey Trujillo is a 61 y.o. female presenting to UC with c/o laceration to her Right index finger around 5PM today.  Pt states she was lowering a plastic bucket full of food her for pig when her finger got cut by the rope attacked to the bucket as it dropped.  Bleeding controlled PTA.  Pain is 8/10, worse with movement. She is not UTD on tetanus. No other injuries. She is Right hand dominant.    Past Medical History:  Diagnosis Date  . Depression   . GERD (gastroesophageal reflux disease)   . Hypertension     There are no active problems to display for this patient.   Past Surgical History:  Procedure Laterality Date  . ABLATION      OB History    No data available       Home Medications    Prior to Admission medications   Medication Sig Start Date End Date Taking? Authorizing Provider  Chlorpheniramine Maleate (ALLERGY PO) Take by mouth.   Yes [provider]  UNKNOWN TO PATIENT    Yes [provider]  Escitalopram Oxalate (LEXAPRO PO) Take by mouth.    [provider]  RaNITidine HCl (ZANTAC PO) Take by mouth.    [provider]    Family History Family History  Problem Relation Age of Onset  . Diabetes Mother   . COPD Mother   . Emphysema Father     Social History Social History  Substance Use Topics  . Smoking status: Former Smoker    Quit date: 02/25/1984  . Smokeless tobacco: Never Used  . Alcohol use No     Allergies   Patient has no known allergies.   Review of Systems Review of Systems  Musculoskeletal: Negative for arthralgias, joint swelling and myalgias.  Skin: Positive for wound. Negative for color change.  Neurological: Negative for weakness and numbness.     Physical Exam Triage  Vital Signs ED Triage Vitals  Enc Vitals Group     BP 01/09/17 1719 (!) 148/92     Pulse Rate 01/09/17 1719 (!) 107     Resp 01/09/17 1719 18     Temp 01/09/17 1719 97.7 F (36.5 C)     Temp Source 01/09/17 1719 Oral     SpO2 01/09/17 1719 96 %     Weight 01/09/17 1720 175 lb (79.4 kg)     Height 01/09/17 1720 5\' 2"  (1.575 m)     Head Circumference --      Peak Flow --      Pain Score 01/09/17 1720 8     Pain Loc --      Pain Edu? --      Excl. in GC? --    No data found.   Updated Vital Signs BP (!) 148/92 (BP Location: Left Arm)   Pulse (!) 107   Temp 97.7 F (36.5 C) (Oral)   Resp 18   Ht 5\' 2"  (1.575 m)   Wt 175 lb (79.4 kg)   SpO2 96%   BMI 32.01 kg/m   Visual Acuity Right Eye Distance:   Left Eye Distance:   Bilateral Distance:    Right Eye Near:   Left Eye Near:  Bilateral Near:     Physical Exam  Constitutional: She is oriented to person, place, and time. She appears well-developed and well-nourished.  HENT:  Head: Normocephalic and atraumatic.  Eyes: EOM are normal.  Neck: Normal range of motion.  Cardiovascular: Normal rate.   Pulmonary/Chest: Effort normal.  Musculoskeletal: Normal range of motion. She exhibits no edema or tenderness.  Right index finger: full ROM. No edema.  Neurological: She is alert and oriented to person, place, and time.  Skin: Skin is warm and dry. Capillary refill takes less than 2 seconds.  Right index finger along DIP joint on volar aspect: 2cm laceration. No tendon involvement. No foreign bodies seen or palpated. Bleeding controlled.   Psychiatric: She has a normal mood and affect. Her behavior is normal.  Nursing note and vitals reviewed.    UC Treatments / Results  Labs (all labs ordered are listed, but only abnormal results are displayed) Labs Reviewed - No data to display  EKG  EKG Interpretation None       Radiology No results found.  Procedures .Marland KitchenLaceration Repair Date/Time: 01/09/2017 6:33  PM Performed by: Lurene Shadow Authorized by: Donna Christen A   Consent:    Consent obtained:  Verbal   Consent given by:  Patient   Risks discussed:  Infection, pain and poor wound healing   Alternatives discussed:  No treatment Anesthesia (see MAR for exact dosages):    Anesthesia method:  Nerve block   Block needle gauge:  25 G   Block anesthetic:  Lidocaine 1% w/o epi   Block technique:  Digital   Block injection procedure:  Anatomic landmarks identified, anatomic landmarks palpated, introduced needle, negative aspiration for blood and incremental injection   Block outcome:  Anesthesia achieved Laceration details:    Location:  Finger   Finger location:  R index finger   Length (cm):  2   Depth (mm):  3 Repair type:    Repair type:  Simple Pre-procedure details:    Preparation:  Patient was prepped and draped in usual sterile fashion Exploration:    Hemostasis achieved with:  Direct pressure   Wound exploration: wound explored through full range of motion and entire depth of wound probed and visualized     Wound extent: areolar tissue violated     Wound extent: no fascia violation noted, no foreign bodies/material noted, no muscle damage noted, no nerve damage noted, no tendon damage noted, no underlying fracture noted and no vascular damage noted     Contaminated: no   Treatment:    Area cleansed with:  Hibiclens and saline   Amount of cleaning:  Standard   Irrigation solution:  Sterile saline   Irrigation volume:  40   Irrigation method:  Syringe Skin repair:    Repair method:  Sutures   Suture size:  4-0   Suture material:  Prolene   Suture technique:  Simple interrupted   Number of sutures:  4 Approximation:    Approximation:  Close   Vermilion border: well-aligned   Post-procedure details:    Dressing:  Antibiotic ointment and bulky dressing   Patient tolerance of procedure:  Tolerated well, no immediate complications   (including critical care  time)  Medications Ordered in UC Medications  Tdap (BOOSTRIX) injection 0.5 mL (0.5 mLs Intramuscular Given 01/09/17 1730)  acetaminophen (TYLENOL) tablet 650 mg (650 mg Oral Given 01/09/17 1727)     Initial Impression / Assessment and Plan / UC Course  I have reviewed the  triage vital signs and the nursing notes.  Pertinent labs & imaging results that were available during my care of the patient were reviewed by me and considered in my medical decision making (see chart for details).     Laceration to Right index finger w/o complication. Tdap updated today in UC   Final Clinical Impressions(s) / UC Diagnoses   Final diagnoses:  Laceration of right index finger without foreign body without damage to nail, initial encounter   Laceration sutured closed w/o immediate complication Home care instructions provided F/u in 10-14 days of suture removal, sooner if signs of infection.   New Prescriptions Discharge Medication List as of 01/09/2017  6:04 PM       Controlled Substance Prescriptions [place] Controlled Substance Registry consulted? Not Applicable   Rolla Plate 01/09/17 1610

## 2017-01-23 ENCOUNTER — Emergency Department
Admission: EM | Admit: 2017-01-23 | Discharge: 2017-01-23 | Disposition: A | Discharge status: Home / Self Care | Payer: 59 | Source: Home / Self Care

## 2017-01-23 ENCOUNTER — Encounter: Payer: Self-pay | Admitting: *Deleted

## 2017-01-23 NOTE — ED Triage Notes (Signed)
Pt is here today for suture removal 

## 2017-02-12 ENCOUNTER — Emergency Department (INDEPENDENT_AMBULATORY_CARE_PROVIDER_SITE_OTHER): Payer: 59

## 2017-02-12 ENCOUNTER — Emergency Department
Admission: EM | Admit: 2017-02-12 | Discharge: 2017-02-12 | Disposition: A | Discharge status: Home / Self Care | Payer: 59 | Source: Home / Self Care | Attending: Family Medicine | Admitting: Family Medicine

## 2017-02-12 ENCOUNTER — Encounter: Payer: Self-pay | Admitting: *Deleted

## 2017-02-12 DIAGNOSIS — S8265XA Nondisplaced fracture of lateral malleolus of left fibula, initial encounter for closed fracture: Secondary | ICD-10-CM | POA: Diagnosis not present

## 2017-02-12 DIAGNOSIS — W109XXA Fall (on) (from) unspecified stairs and steps, initial encounter: Secondary | ICD-10-CM

## 2017-02-12 DIAGNOSIS — X501XXA Overexertion from prolonged static or awkward postures, initial encounter: Secondary | ICD-10-CM

## 2017-02-12 NOTE — ED Triage Notes (Signed)
Pt c/o LT ankle pain and swelling post fall this morning at home. She applied ice and elevated. No OTC meds.

## 2017-02-12 NOTE — Discharge Instructions (Signed)
° °  You may take  acetaminophen every 4-6 hours or in combination with ibuprofen 400-600mg  every 6-8 hours as needed for pain and inflammation.    You may remove the boot to shower and to elevate leg 2-3 times a day to apply a cool compress to help with swelling and pain.

## 2017-02-12 NOTE — ED Provider Notes (Signed)
[facility]    CSN: [id number] Arrival date & time: 02/12/17  1052     History   Chief Complaint Chief Complaint  Patient presents with  . Ankle Pain    HPI Lindsey Trujillo is a 61 y.o. female.   HPI  Lindsey Trujillo is a 61 y.o. female presenting to UC with c/o sudden onset Left ankle pain and swelling that started this morning after trip on stairs, causing her ankle to roll.  Pain is mild, aching, worse with ambulation.  She did not notice swelling until she got to work.  She has applied ice and elevated it but has not taken any pain medication as pain is minimal. No prior injury or surgery to same ankle.    Past Medical History:  Diagnosis Date  . Depression   . GERD (gastroesophageal reflux disease)   . Hypertension     There are no active problems to display for this patient.   Past Surgical History:  Procedure Laterality Date  . ABLATION      OB History    No data available       Home Medications    Prior to Admission medications   Medication Sig Start Date End Date Taking? Authorizing Provider  Chlorpheniramine Maleate (ALLERGY PO) Take by mouth.    [provider]  Escitalopram Oxalate (LEXAPRO PO) Take by mouth.    [provider]  RaNITidine HCl (ZANTAC PO) Take by mouth.    [provider]  UNKNOWN TO PATIENT     [provider]    Family History Family History  Problem Relation Age of Onset  . Diabetes Mother   . COPD Mother   . Emphysema Father     Social History Social History  Substance Use Topics  . Smoking status: Former Smoker    Quit date: 02/25/1984  . Smokeless tobacco: Never Used  . Alcohol use No     Allergies   Patient has no known allergies.   Review of Systems Review of Systems  Musculoskeletal: Positive for arthralgias and joint swelling. Negative for gait problem and myalgias.  Skin: Negative for color change and wound.  Neurological: Negative for weakness and numbness.      Physical Exam Triage Vital Signs ED Triage Vitals  Enc Vitals Group     BP 02/12/17 1109 108/73     Pulse Rate 02/12/17 1109 93     Resp 02/12/17 1109 16     Temp 02/12/17 1109 97.7 F (36.5 C)     Temp Source 02/12/17 1109 Oral     SpO2 02/12/17 1109 96 %     Weight 02/12/17 1110 177 lb (80.3 kg)     Height --      Head Circumference --      Peak Flow --      Pain Score 02/12/17 1110 3     Pain Loc --      Pain Edu? --      Excl. in GC? --    No data found.   Updated Vital Signs BP 108/73 (BP Location: Left Arm)   Pulse 93   Temp 97.7 F (36.5 C) (Oral)   Resp 16   Wt 177 lb (80.3 kg)   SpO2 96%   BMI 32.37 kg/m   Visual Acuity Right Eye Distance:   Left Eye Distance:   Bilateral Distance:    Right Eye Near:   Left Eye Near:    Bilateral Near:  Physical Exam  Constitutional: She is oriented to person, place, and time. She appears well-developed and well-nourished.  HENT:  Head: Normocephalic and atraumatic.  Eyes: EOM are normal.  Neck: Normal range of motion.  Cardiovascular: Normal rate.   Pulses:      Dorsalis pedis pulses are 2+ on the left side.       Posterior tibial pulses are 2+ on the left side.  Pulmonary/Chest: Effort normal.  Musculoskeletal: Normal range of motion. She exhibits edema and tenderness.  Left ankle: moderate edema. Mild tenderness to lateral aspect. Full ROM ankle and toes No tenderness to Left calf or foot.   Neurological: She is alert and oriented to person, place, and time.  Skin: Skin is warm and dry. Capillary refill takes less than 2 seconds.  Left ankle and foot: skin in tact. No ecchymosis or erythema.  Psychiatric: She has a normal mood and affect. Her behavior is normal.  Nursing note and vitals reviewed.    UC Treatments / Results  Labs (all labs ordered are listed, but only abnormal results are displayed) Labs Reviewed - No data to display  EKG  EKG Interpretation None       Radiology Dg  Ankle Complete Left  Result Date: 02/12/2017 CLINICAL DATA:  Left ankle pain, twisting injury. EXAM: LEFT ANKLE COMPLETE - 3+ VIEW COMPARISON:  None. FINDINGS: There is marked lateral soft tissue swelling. Linear nondisplaced fracture through the tip of the lateral malleolus. No tibial abnormality. Ankle mortise is intact. IMPRESSION: Nondisplaced fracture through the tip of the lateral malleolus. Electronically Signed   By: Charlett Nose M.D.   On: 02/12/2017 11:32    Procedures Procedures (including critical care time)  Medications Ordered in UC Medications - No data to display   Initial Impression / Assessment and Plan / UC Course  I have reviewed the triage vital signs and the nursing notes.  Pertinent labs & imaging results that were available during my care of the patient were reviewed by me and considered in my medical decision making (see chart for details).     Hx and exam c/w closed lateral malleolus fracture.  Pt placed in walking boot to aid pain and healing.  May remove boot to bath and ice/elevate ankle F/u with Sports Medicine in 1 week for recheck of symptoms and continued care.   Final Clinical Impressions(s) / UC Diagnoses   Final diagnoses:  Closed nondisplaced fracture of lateral malleolus of left fibula, initial encounter  Fall on stairs, initial encounter    New Prescriptions Discharge Medication List as of 02/12/2017 11:43 AM       Controlled Substance Prescriptions [place] Controlled Substance Registry consulted? Not Applicable   Rolla Plate 02/12/17 1231

## 2017-02-13 ENCOUNTER — Telehealth: Payer: Self-pay | Admitting: Emergency Medicine

## 2017-02-19 ENCOUNTER — Institutional Professional Consult (permissible substitution): Payer: 59 | Admitting: Family Medicine

## 2017-04-08 DIAGNOSIS — F330 Major depressive disorder, recurrent, mild: Secondary | ICD-10-CM | POA: Insufficient documentation

## 2017-04-19 ENCOUNTER — Encounter: Payer: Self-pay | Admitting: Emergency Medicine

## 2017-04-19 ENCOUNTER — Other Ambulatory Visit: Payer: Self-pay

## 2017-04-19 ENCOUNTER — Emergency Department
Admission: EM | Admit: 2017-04-19 | Discharge: 2017-04-19 | Disposition: A | Discharge status: Home / Self Care | Payer: 59 | Source: Home / Self Care | Attending: Family Medicine | Admitting: Family Medicine

## 2017-04-19 DIAGNOSIS — J069 Acute upper respiratory infection, unspecified: Secondary | ICD-10-CM | POA: Diagnosis not present

## 2017-04-19 DIAGNOSIS — R05 Cough: Secondary | ICD-10-CM

## 2017-04-19 DIAGNOSIS — R059 Cough, unspecified: Secondary | ICD-10-CM

## 2017-04-19 MED ORDER — AZITHROMYCIN 250 MG PO TABS: 250.0000 mg | ORAL_TABLET | Freq: Every day | ORAL | 0 refills | Status: DC

## 2017-04-19 MED ORDER — BENZONATATE 100 MG PO CAPS: 100.0000 mg | ORAL_CAPSULE | Freq: Three times a day (TID) | ORAL | 0 refills | Status: DC

## 2017-04-19 NOTE — ED Provider Notes (Signed)
[facility]    CSN: [id number] Arrival date & time: 04/19/17  1842     History   Chief Complaint Chief Complaint  Patient presents with  . Cough    HPI Lindsey Trujillo is a 61 y.o. female.   HPI  Lindsey Trujillo is a 61 y.o. female presenting to UC with c/o 1 week of cough that has been mildly productive. It was initially productive with clear sputum, the other night she forgot to take her Mucinex but states she slept through the night w/o coughing, she thought she was improving but over the last 2 days she has had coughing episodes where cough is dry until she finally coughs up a small amount of yellow phlegm.  Denies fever, chills, n/v/d. No known sick contacts. Denies chest pain or SOB. No hx of asthma.    Past Medical History:  Diagnosis Date  . Depression   . GERD (gastroesophageal reflux disease)   . Hypertension     There are no active problems to display for this patient.   Past Surgical History:  Procedure Laterality Date  . ABLATION      OB History    No data available       Home Medications    Prior to Admission medications   Medication Sig Start Date End Date Taking? Authorizing Provider  azithromycin (ZITHROMAX) 250 MG tablet Take 1 tablet (250 mg total) by mouth daily. Take first 2 tablets together, then 1 every day until finished. 04/19/17   Lurene ShadowPhelps, Mayuri Staples O, PA-C  benzonatate (TESSALON) 100 MG capsule Take 1-2 capsules (100-200 mg total) by mouth every 8 (eight) hours. 04/19/17   Lurene ShadowPhelps, Katesha Eichel O, PA-C  Chlorpheniramine Maleate (ALLERGY PO) Take by mouth.    [provider]  Escitalopram Oxalate (LEXAPRO PO) Take by mouth.    [provider]  RaNITidine HCl (ZANTAC PO) Take by mouth.    [provider]  UNKNOWN TO PATIENT     [provider]    Family History Family History  Problem Relation Age of Onset  . Diabetes Mother   . COPD Mother   . Emphysema Father     Social History Social History    Tobacco Use  . Smoking status: Former Smoker    Last attempt to quit: 02/25/1984    Years since quitting: 33.1  . Smokeless tobacco: Never Used  Substance Use Topics  . Alcohol use: No  . Drug use: No     Allergies   Patient has no known allergies.   Review of Systems Review of Systems  Constitutional: Negative for chills and fever.  HENT: Positive for congestion and sore throat ( scratchy). Negative for ear pain, trouble swallowing and voice change.   Respiratory: Positive for cough. Negative for shortness of breath.   Cardiovascular: Negative for chest pain and palpitations.  Gastrointestinal: Negative for abdominal pain, diarrhea, nausea and vomiting.  Musculoskeletal: Negative for arthralgias, back pain and myalgias.  Skin: Negative for rash.  Neurological: Positive for headaches. Negative for dizziness and light-headedness.     Physical Exam Triage Vital Signs ED Triage Vitals  Enc Vitals Group     BP 04/19/17 1857 128/85     Pulse Rate 04/19/17 1857 88     Resp 04/19/17 1857 16     Temp 04/19/17 1857 97.9 F (36.6 C)     Temp Source 04/19/17 1857 Oral     SpO2 04/19/17 1857 95 %     Weight --  Height --      Head Circumference --      Peak Flow --      Pain Score 04/19/17 1858 2     Pain Loc --      Pain Edu? --      Excl. in GC? --    No data found.  Updated Vital Signs BP 128/85 (BP Location: Right Arm)   Pulse 88   Temp 97.9 F (36.6 C) (Oral)   Resp 16   SpO2 95%   Visual Acuity Right Eye Distance:   Left Eye Distance:   Bilateral Distance:    Right Eye Near:   Left Eye Near:    Bilateral Near:     Physical Exam  Constitutional: She is oriented to person, place, and time. She appears well-developed and well-nourished. No distress.  HENT:  Head: Normocephalic and atraumatic.  Right Ear: Tympanic membrane normal.  Left Ear: Tympanic membrane normal.  Nose: Mucosal edema present. Right sinus exhibits no maxillary sinus tenderness  and no frontal sinus tenderness. Left sinus exhibits no maxillary sinus tenderness and no frontal sinus tenderness.  Mouth/Throat: Uvula is midline, oropharynx is clear and moist and mucous membranes are normal.  Eyes: EOM are normal.  Neck: Normal range of motion. Neck supple.  Cardiovascular: Normal rate and regular rhythm.  Pulmonary/Chest: Effort normal. No stridor. No respiratory distress. She has no wheezes. She has rhonchi ( faint, clears with cough). She has no rales.  Musculoskeletal: Normal range of motion.  Neurological: She is alert and oriented to person, place, and time.  Skin: Skin is warm and dry. She is not diaphoretic.  Psychiatric: She has a normal mood and affect. Her behavior is normal.  Nursing note and vitals reviewed.    UC Treatments / Results  Labs (all labs ordered are listed, but only abnormal results are displayed) Labs Reviewed - No data to display  EKG  EKG Interpretation None       Radiology No results found.  Procedures Procedures (including critical care time)  Medications Ordered in UC Medications - No data to display   Initial Impression / Assessment and Plan / UC Course  I have reviewed the triage vital signs and the nursing notes.  Pertinent labs & imaging results that were available during my care of the patient were reviewed by me and considered in my medical decision making (see chart for details).     Hx and exam c/w viral illness Encouraged continued symptomatic treatment with Tessalon, fluids and rest Prescription to hold with expiration date for azithromycin. Pt to fill if persistent fever develops or not improving in 1 week.  F/u with PCP in 1 week if needed.   Final Clinical Impressions(s) / UC Diagnoses   Final diagnoses:  Upper respiratory tract infection, unspecified type  Cough    ED Discharge Orders        Ordered    benzonatate (TESSALON) 100 MG capsule  Every 8 hours     04/19/17 1913    azithromycin  (ZITHROMAX) 250 MG tablet  Daily    Comments:  Void after 05/08/17   04/19/17 1913       Controlled Substance Prescriptions [place] Controlled Substance Registry consulted? Not Applicable   Rolla Platehelps, Nicklaus Alviar O, PA-C 04/19/17 1919

## 2017-04-19 NOTE — Discharge Instructions (Signed)
°  Your symptoms are likely due to a virus such as the common cold, however, if you developing worsening chest congestion with shortness of breath, persistent fever (> 100.4*F) for 3 days, or symptoms not improving in 4-5 days, you may fill the antibiotic (azithromycin).  If you do fill the antibiotic,  please take antibiotics as prescribed and be sure to complete entire course even if you start to feel better to ensure infection does not come back. ° °

## 2017-04-19 NOTE — ED Triage Notes (Signed)
Cough with yellow discharge x 1 week

## 2017-04-22 ENCOUNTER — Telehealth: Payer: Self-pay

## 2017-04-22 NOTE — Telephone Encounter (Signed)
Left VM with contact information if any questions or concerns.

## 2017-04-23 ENCOUNTER — Telehealth: Payer: Self-pay | Admitting: Emergency Medicine

## 2017-04-23 ENCOUNTER — Telehealth: Payer: Self-pay

## 2017-04-23 NOTE — Telephone Encounter (Signed)
Notified patient of information listed below.

## 2017-04-23 NOTE — Telephone Encounter (Signed)
Please suggest over the counter Nyquil or Benadryl to help with sleep.  She may also benefit from running a humidifier at night to help prevent dry air due to heat running during cold nights.  She may also try taking a warm bath or shower to help open up her airways before going to sleep and sleeping propped up to help nasal congestion drain. Please have her follow up with her PCP in 1 week if not improving, sooner if significantly worsening.

## 2018-01-29 DIAGNOSIS — R5383 Other fatigue: Secondary | ICD-10-CM | POA: Insufficient documentation

## 2018-01-29 DIAGNOSIS — L989 Disorder of the skin and subcutaneous tissue, unspecified: Secondary | ICD-10-CM | POA: Insufficient documentation

## 2018-05-20 ENCOUNTER — Other Ambulatory Visit: Payer: Self-pay

## 2018-05-20 ENCOUNTER — Emergency Department
Admission: EM | Admit: 2018-05-20 | Discharge: 2018-05-20 | Disposition: A | Discharge status: Home / Self Care | Payer: Managed Care, Other (non HMO) | Source: Home / Self Care | Attending: Family Medicine | Admitting: Family Medicine

## 2018-05-20 DIAGNOSIS — J069 Acute upper respiratory infection, unspecified: Secondary | ICD-10-CM

## 2018-05-20 DIAGNOSIS — B9789 Other viral agents as the cause of diseases classified elsewhere: Secondary | ICD-10-CM

## 2018-05-20 DIAGNOSIS — J029 Acute pharyngitis, unspecified: Secondary | ICD-10-CM | POA: Diagnosis not present

## 2018-05-20 NOTE — ED Provider Notes (Signed)
[facility]    CSN: [id number] Arrival date & time: 05/20/18  1227     History   Chief Complaint Chief Complaint  Patient presents with  . Sore Throat    HPI Lindsey CarrowDebra Kysar is a 62 y.o. female.   HPI  Lindsey Trujillo is a 62 y.o. female presenting to UC with c/o sore throat and nasal congestion with mild HA that started after waking up this morning. Pt took Advil Cold and Sinus with mild relief. Pt concerned about waking up worse tomorrow on Christmas and the UC is closed. Denies fever, chills, n/v/d. No known sick contacts.    Past Medical History:  Diagnosis Date  . Depression   . GERD (gastroesophageal reflux disease)   . Hypertension     There are no active problems to display for this patient.   Past Surgical History:  Procedure Laterality Date  . ABLATION      OB History   No obstetric history on file.      Home Medications    Prior to Admission medications   Medication Sig Start Date End Date Taking? Authorizing Provider  azithromycin (ZITHROMAX) 250 MG tablet Take 1 tablet (250 mg total) by mouth daily. Take first 2 tablets together, then 1 every day until finished. 04/19/17   Lurene ShadowPhelps, Mava Suares O, PA-C  benzonatate (TESSALON) 100 MG capsule Take 1-2 capsules (100-200 mg total) by mouth every 8 (eight) hours. 04/19/17   Lurene ShadowPhelps, Jaryah Aracena O, PA-C  Chlorpheniramine Maleate (ALLERGY PO) Take by mouth.    [provider]  Escitalopram Oxalate (LEXAPRO PO) Take by mouth.    [provider]  RaNITidine HCl (ZANTAC PO) Take by mouth.    [provider]  UNKNOWN TO PATIENT     [provider]    Family History Family History  Problem Relation Age of Onset  . Diabetes Mother   . COPD Mother   . Emphysema Father     Social History Social History   Tobacco Use  . Smoking status: Former Smoker    Last attempt to quit: 02/25/1984    Years since quitting: 34.2  . Smokeless tobacco: Never Used  Substance Use Topics  .  Alcohol use: No  . Drug use: No     Allergies   Patient has no known allergies.   Review of Systems Review of Systems  Constitutional: Negative for chills and fever.  HENT: Positive for congestion and sore throat. Negative for ear pain, trouble swallowing and voice change.   Respiratory: Positive for cough. Negative for shortness of breath.   Cardiovascular: Negative for chest pain and palpitations.  Gastrointestinal: Negative for abdominal pain, diarrhea, nausea and vomiting.  Musculoskeletal: Negative for arthralgias, back pain and myalgias.  Skin: Negative for rash.     Physical Exam Triage Vital Signs ED Triage Vitals [05/20/18 1254]  Enc Vitals Group     BP (!) 150/84     Pulse Rate 83     Resp 18     Temp 97.9 F (36.6 C)     Temp Source Oral     SpO2 96 %     Weight 176 lb (79.8 kg)     Height 5\' 5"  (1.651 m)     Head Circumference      Peak Flow      Pain Score 0     Pain Loc      Pain Edu?      Excl. in GC?    No  data found.  Updated Vital Signs BP (!) 150/84 (BP Location: Right Arm)   Pulse 83   Temp 97.9 F (36.6 C) (Oral)   Resp 18   Ht 5\' 5"  (1.651 m)   Wt 176 lb (79.8 kg)   SpO2 96%   BMI 29.29 kg/m   Visual Acuity Right Eye Distance:   Left Eye Distance:   Bilateral Distance:    Right Eye Near:   Left Eye Near:    Bilateral Near:     Physical Exam Vitals signs and nursing note reviewed.  Constitutional:      Appearance: She is well-developed.  HENT:     Head: Normocephalic and atraumatic.     Right Ear: Tympanic membrane normal.     Left Ear: Tympanic membrane normal.     Nose: Nose normal.     Mouth/Throat:     Lips: Pink.     Mouth: Mucous membranes are moist.     Pharynx: Oropharynx is clear. Uvula midline. No pharyngeal swelling, oropharyngeal exudate, posterior oropharyngeal erythema or uvula swelling.  Neck:     Musculoskeletal: Normal range of motion and neck supple.  Cardiovascular:     Rate and Rhythm: Normal rate  and regular rhythm.  Pulmonary:     Effort: Pulmonary effort is normal. No respiratory distress.     Breath sounds: Normal breath sounds. No stridor. No wheezing or rhonchi.  Musculoskeletal: Normal range of motion.  Skin:    General: Skin is warm and dry.  Neurological:     Mental Status: She is alert and oriented to person, place, and time.  Psychiatric:        Behavior: Behavior normal.      UC Treatments / Results  Labs (all labs ordered are listed, but only abnormal results are displayed) Labs Reviewed - No data to display  EKG None  Radiology No results found.  Procedures Procedures (including critical care time)  Medications Ordered in UC Medications - No data to display  Initial Impression / Assessment and Plan / UC Course  I have reviewed the triage vital signs and the nursing notes.  Pertinent labs & imaging results that were available during my care of the patient were reviewed by me and considered in my medical decision making (see chart for details).     Normal exam Doubt strep Reassured pt, likely viral illness. Encouraged symptomatic tx   Final Clinical Impressions(s) / UC Diagnoses   Final diagnoses:  Viral pharyngitis  Viral URI with cough     Discharge Instructions      You may take 500mg  acetaminophen every 4-6 hours or in combination with ibuprofen 400-600mg  every 6-8 hours as needed for pain, inflammation, and fever.  Be sure to well hydrated with clear liquids and get at least 8 hours of sleep at night, preferably more while sick.   Please follow up with family medicine in 1 week if needed.     ED Prescriptions    None     Controlled Substance Prescriptions [place] Controlled Substance Registry consulted? Not Applicable   Rolla Platehelps, Georgenia Salim O, PA-C 05/20/18 1351

## 2018-05-20 NOTE — ED Triage Notes (Signed)
Woke up with a sore throat this am.  Had a headache during the night.  Took advil cold and sinus this am.

## 2018-05-20 NOTE — Discharge Instructions (Signed)
  You may take 500mg acetaminophen every 4-6 hours or in combination with ibuprofen 400-600mg every 6-8 hours as needed for pain, inflammation, and fever.  Be sure to well hydrated with clear liquids and get at least 8 hours of sleep at night, preferably more while sick.   Please follow up with family medicine in 1 week if needed.   

## 2019-02-12 DIAGNOSIS — F430 Acute stress reaction: Secondary | ICD-10-CM | POA: Insufficient documentation

## 2019-02-12 DIAGNOSIS — K580 Irritable bowel syndrome with diarrhea: Secondary | ICD-10-CM | POA: Insufficient documentation

## 2019-05-30 ENCOUNTER — Other Ambulatory Visit: Payer: Self-pay

## 2019-05-30 ENCOUNTER — Emergency Department
Admission: EM | Admit: 2019-05-30 | Discharge: 2019-05-30 | Disposition: A | Discharge status: Home / Self Care | Payer: Managed Care, Other (non HMO) | Source: Home / Self Care | Attending: Family Medicine | Admitting: Family Medicine

## 2019-05-30 ENCOUNTER — Encounter: Payer: Self-pay | Admitting: Emergency Medicine

## 2019-05-30 DIAGNOSIS — R059 Cough, unspecified: Secondary | ICD-10-CM

## 2019-05-30 DIAGNOSIS — R05 Cough: Secondary | ICD-10-CM | POA: Diagnosis not present

## 2019-05-30 DIAGNOSIS — J069 Acute upper respiratory infection, unspecified: Secondary | ICD-10-CM

## 2019-05-30 NOTE — ED Triage Notes (Signed)
Patient reports 5-6 days of cough, congestion, fatigue and body aches; occasional low grade fever at night of 100.1; no OTCs past 4 hours.  She has had influenza vacc this season. She has no known contact covid positive person.

## 2019-05-30 NOTE — Discharge Instructions (Addendum)
Take plain guaifenesin (1200mg  extended release tabs such as Mucinex) twice daily, with plenty of water, for cough and congestion.  May add Pseudoephedrine (30mg , one or two every 4 to 6 hours) for sinus congestion.  Get adequate rest.    Also recommend using saline nasal spray several times daily and saline nasal irrigation (AYR is a common brand).    May take Delsym Cough Suppressant at bedtime for nighttime cough.  Try warm salt water gargles for sore throat.  Stop all antihistamines for now, and other non-prescription cough/cold preparations. May take Ibuprofen 200mg , 4 tabs every 8 hours with food for headache, body aches, etc.   Isolate yourself until COVID-19 test result is available.   If your COVID19 test is positive, then you are infected with the novel coronavirus and could give the virus to others.  Please continue isolation at home for at least 10 days since the start of your symptoms. Once you complete your 10 day quarantine, you may return to normal activities as long as you've not had a fever for over 24 hours (without taking fever reducing medicine) and your symptoms are improving. Please continue good preventive care measures, including:  frequent hand-washing, avoid touching your face, cover coughs/sneezes, stay out of crowds and keep a 6 foot distance from others.  Go to the nearest hospital emergency room if fever/cough/breathlessness are severe or illness seems like a threat to life.

## 2019-05-30 NOTE — ED Provider Notes (Signed)
[facility]    CSN: [id number] Arrival date & time: 05/30/19  0813      History   Chief Complaint Chief Complaint  Patient presents with  . Cough  . Generalized Body Aches  . Fatigue  . Nasal Congestion    HPI Lindsey Trujillo is a 64 y.o. female.   Patient complains of six day history of typical cold-like symptoms developing over several days, including mild sore throat, sinus congestion, headache, fatigue, and cough.  She has had low grade fever and chills/sweats.   She denies chest tightness, shortness of breath, and changes in taste/smell.    The history is provided by the patient.    Past Medical History:  Diagnosis Date  . Depression   . GERD (gastroesophageal reflux disease)   . Hypertension     There are no problems to display for this patient.   Past Surgical History:  Procedure Laterality Date  . ABLATION      OB History   No obstetric history on file.      Home Medications    Prior to Admission medications   Medication Sig Start Date End Date Taking? Authorizing Provider  ALPRAZolam Duanne Moron) 1 MG tablet Take 1 mg by mouth at bedtime as needed for anxiety.   Yes [provider]  buPROPion (WELLBUTRIN XL) 150 MG 24 hr tablet Take 150 mg by mouth daily.   Yes [provider]  escitalopram (LEXAPRO) 20 MG tablet Take 20 mg by mouth daily.   Yes [provider]  omeprazole (PRILOSEC) 20 MG capsule Take 20 mg by mouth daily.   Yes [provider]  azithromycin (ZITHROMAX) 250 MG tablet Take 1 tablet (250 mg total) by mouth daily. Take first 2 tablets together, then 1 every day until finished. 04/19/17   Noe Gens, PA-C  benzonatate (TESSALON) 100 MG capsule Take 1-2 capsules (100-200 mg total) by mouth every 8 (eight) hours. 04/19/17   Noe Gens, PA-C  Chlorpheniramine Maleate (ALLERGY PO) Take by mouth.    [provider]  Escitalopram Oxalate (LEXAPRO PO) Take by mouth.    [provider]  RaNITidine HCl (ZANTAC PO) Take by mouth.    [provider]  UNKNOWN TO PATIENT     [provider]    Family History Family History  Problem Relation Age of Onset  . Diabetes Mother   . COPD Mother   . Emphysema Father     Social History Social History   Tobacco Use  . Smoking status: Former Smoker    Quit date: 02/25/1984    Years since quitting: 35.2  . Smokeless tobacco: Never Used  Substance Use Topics  . Alcohol use: No  . Drug use: No     Allergies   Patient has no known allergies.   Review of Systems Review of Systems + sore throat + cough No pleuritic pain No wheezing + nasal congestion + post-nasal drainage No sinus pain/pressure No itchy/red eyes No earache No hemoptysis No SOB + fever, + chills No nausea No vomiting No abdominal pain No diarrhea No urinary symptoms No skin rash + fatigue + myalgias + headache Used OTC meds (Advil Cold/sinus) without relief   Physical Exam Triage Vital Signs ED Triage Vitals  Enc Vitals Group     BP 05/30/19 0837 115/79     Pulse Rate 05/30/19 0837 (!) 105     Resp 05/30/19 0837 18     Temp 05/30/19 0837 98.3  F (36.8 C)     Temp src --      SpO2 05/30/19 0837 97 %     Weight 05/30/19 0838 175 lb (79.4 kg)     Height 05/30/19 0838 5\' 5"  (1.651 m)     Head Circumference --      Peak Flow --      Pain Score 05/30/19 0838 1     Pain Loc --      Pain Edu? --      Excl. in GC? --    No data found.  Updated Vital Signs BP 115/79 (BP Location: Right Arm)   Pulse (!) 105   Temp 98.3 F (36.8 C)   Resp 18   Ht 5\' 5"  (1.651 m)   Wt 79.4 kg   SpO2 97%   BMI 29.12 kg/m   Visual Acuity Right Eye Distance:   Left Eye Distance:   Bilateral Distance:    Right Eye Near:   Left Eye Near:    Bilateral Near:     Physical Exam Nursing notes and Vital Signs reviewed. Appearance:  Patient appears stated age, and in no acute distress Eyes:  Pupils are equal,  round, and reactive to light and accomodation.  Extraocular movement is intact.  Conjunctivae are not inflamed  Ears:  Canals normal.  Tympanic membranes normal.  Nose:  Mildly congested turbinates.  No sinus tenderness.  Pharynx:  Normal Neck:  Supple.  Mildly enlarged lateral nodes are present, tender to palpation on the left.   Lungs:  Clear to auscultation.  Breath sounds are equal.  Moving air well. Heart:  Regular rate and rhythm without murmurs, rubs, or gallops.  Abdomen:  Nontender without masses or hepatosplenomegaly.  Bowel sounds are present.  No CVA or flank tenderness.  Extremities:  No edema.  Skin:  No rash present.   UC Treatments / Results  Labs (all labs ordered are listed, but only abnormal results are displayed) Labs Reviewed  SARS-COV-2 RNA,(COVID-19) QUALITATIVE NAAT    EKG   Radiology No results found.  Procedures Procedures (including critical care time)  Medications Ordered in UC Medications - No data to display  Initial Impression / Assessment and Plan / UC Course  I have reviewed the triage vital signs and the nursing notes.  Pertinent labs & imaging results that were available during my care of the patient were reviewed by me and considered in my medical decision making (see chart for details).    Benign exam.  There is no evidence of bacterial infection today.  Treat symptomatically for now  COVID19 send out   Final Clinical Impressions(s) / UC Diagnoses   Final diagnoses:  Cough  Viral URI     Discharge Instructions     Take plain guaifenesin (1200mg  extended release tabs such as Mucinex) twice daily, with plenty of water, for cough and congestion.  May add Pseudoephedrine (30mg , one or two every 4 to 6 hours) for sinus congestion.  Get adequate rest.    Also recommend using saline nasal spray several times daily and saline nasal irrigation (AYR is a common brand).    May take Delsym Cough Suppressant at bedtime for nighttime cough.    Try warm salt water gargles for sore throat.  Stop all antihistamines for now, and other non-prescription cough/cold preparations. May take Ibuprofen 200mg , 4 tabs every 8 hours with food for headache, body aches, etc.   Isolate yourself until COVID-19 test result is available.   If your  COVID19 test is positive, then you are infected with the novel coronavirus and could give the virus to others.  Please continue isolation at home for at least 10 days since the start of your symptoms. Once you complete your 10 day quarantine, you may return to normal activities as long as you've not had a fever for over 24 hours (without taking fever reducing medicine) and your symptoms are improving. Please continue good preventive care measures, including:  frequent hand-washing, avoid touching your face, cover coughs/sneezes, stay out of crowds and keep a 6 foot distance from others.  Go to the nearest hospital emergency room if fever/cough/breathlessness are severe or illness seems like a threat to life.     ED Prescriptions    None        Lattie Haw, MD 05/30/19 [phone]

## 2019-06-01 LAB — SARS-COV-2 RNA,(COVID-19) QUALITATIVE NAAT: SARS CoV2 RNA: DETECTED — AB

## 2019-06-02 ENCOUNTER — Telehealth: Payer: Self-pay | Admitting: Emergency Medicine

## 2019-06-02 NOTE — Telephone Encounter (Signed)

## 2019-12-19 ENCOUNTER — Emergency Department
Admission: EM | Admit: 2019-12-19 | Discharge: 2019-12-19 | Disposition: A | Discharge status: Home / Self Care | Payer: Managed Care, Other (non HMO) | Source: Home / Self Care | Attending: Emergency Medicine | Admitting: Emergency Medicine

## 2019-12-19 ENCOUNTER — Other Ambulatory Visit: Payer: Self-pay

## 2019-12-19 ENCOUNTER — Emergency Department (INDEPENDENT_AMBULATORY_CARE_PROVIDER_SITE_OTHER): Payer: Managed Care, Other (non HMO)

## 2019-12-19 ENCOUNTER — Encounter: Payer: Self-pay | Admitting: Emergency Medicine

## 2019-12-19 DIAGNOSIS — G43909 Migraine, unspecified, not intractable, without status migrainosus: Secondary | ICD-10-CM | POA: Insufficient documentation

## 2019-12-19 DIAGNOSIS — R7301 Impaired fasting glucose: Secondary | ICD-10-CM | POA: Insufficient documentation

## 2019-12-19 DIAGNOSIS — Z79899 Other long term (current) drug therapy: Secondary | ICD-10-CM | POA: Insufficient documentation

## 2019-12-19 DIAGNOSIS — M25511 Pain in right shoulder: Secondary | ICD-10-CM

## 2019-12-19 DIAGNOSIS — K219 Gastro-esophageal reflux disease without esophagitis: Secondary | ICD-10-CM | POA: Insufficient documentation

## 2019-12-19 DIAGNOSIS — Z889 Allergy status to unspecified drugs, medicaments and biological substances status: Secondary | ICD-10-CM | POA: Insufficient documentation

## 2019-12-19 DIAGNOSIS — M25521 Pain in right elbow: Secondary | ICD-10-CM | POA: Diagnosis not present

## 2019-12-19 DIAGNOSIS — E559 Vitamin D deficiency, unspecified: Secondary | ICD-10-CM | POA: Insufficient documentation

## 2019-12-19 DIAGNOSIS — M778 Other enthesopathies, not elsewhere classified: Secondary | ICD-10-CM

## 2019-12-19 MED ORDER — TRAMADOL HCL 50 MG PO TABS: 50.0000 mg | ORAL_TABLET | Freq: Every evening | ORAL | 0 refills | Status: DC

## 2019-12-19 NOTE — ED Provider Notes (Signed)
[facility]    CSN: [id number] Arrival date & time: 12/19/19  0818      History   Chief Complaint Chief Complaint  Patient presents with  . Arm Pain    right    HPI Lindsey Trujillo is a 64 y.o. female.   HPI Pain to upper right arm x 1 month.  She remembers an accidental fall about a month ago injuring right elbow and right upper arm and right shoulder, but she continued to do activities.  Yesterday, worked 8 hours of computer and pain was worse and made it difficult to sleep.  Pain right shoulder and elbow and biceps area is sharp and dull at times and worse with movement.  - had tennis elbow 1-1/2 years ago, evaluated at [facility]- went to PT at that time and that got significantly better until this pain started a month ago. Ice improves it. Yesterday, she tried Tylenol arthritis OTC, which helped somewhat , but was she states aggravated her IBS, so she does not want to take Tylenol.  States she cannot take NSAIDs because of IBS. No meds today -current pain is an 8/10.  It is both sharp and dull tense muscle spasm. Denies any chest pain or shortness of breath or fever or chills or nausea or vomiting.  No cough or URI symptoms.  Past Medical History:  Diagnosis Date  . Depression   . GERD (gastroesophageal reflux disease)   . Hypertension     Patient Active Problem List   Diagnosis Date Noted  . Encounter for long-term (current) use of other medications 12/19/2019  . GERD (gastroesophageal reflux disease) 12/19/2019  . H/O seasonal allergies 12/19/2019  . IFG (impaired fasting glucose) 12/19/2019  . Migraine without status migrainosus, not intractable 12/19/2019  . Vitamin D deficiency 12/19/2019  . Irritable bowel syndrome with diarrhea 02/12/2019  . Stress reaction 02/12/2019  . Fatigue 01/29/2018  . Skin lesion 01/29/2018  . Major depressive disorder, recurrent, mild (HCC) 04/08/2017  . Benign hematuria 05/08/2016  . Essential hypertension  with goal blood pressure less than 130/85 10/05/2014    Past Surgical History:  Procedure Laterality Date  . ABLATION    Denies orthopedic surgery in the past.  OB History   No obstetric history on file.      Home Medications    Prior to Admission medications   Medication Sig Start Date End Date Taking? Authorizing Provider  Chlorpheniramine Maleate (ALLERGY PO) Take by mouth.   Yes [provider]  escitalopram (LEXAPRO) 20 MG tablet Take 20 mg by mouth daily.   Yes [provider]  famotidine (PEPCID) 20 MG tablet Take 20 mg by mouth 2 (two) times daily. 11/26/19  Yes [provider]  hydrochlorothiazide (HYDRODIURIL) 25 MG tablet Take 25 mg by mouth daily. 11/26/19  Yes [provider]  cyanocobalamin ([employer] VITAMIN B12) 1000 MCG tablet Take by mouth.    [provider]  nortriptyline (PAMELOR) 10 MG capsule Take 1 capsule by mouth at bedtime. 02/12/19   [provider]  SUMAtriptan (IMITREX) 50 MG tablet TAKE 1 TABLET BY MOUTH AT  ONSET OF HEADACHE, MAY  REPEAT IN 2 HOURS IF  NEEDED. MAXIMUM OF 2  TABLETS IN 24 HOURS 10/29/19   [provider]  traMADol (ULTRAM) 50 MG tablet Take 1 tablet (50 mg total) by mouth at bedtime. As needed for severe pain. 12/19/19   Lajean Manes, MD  UNKNOWN TO PATIENT     [provider]  buPROPion (WELLBUTRIN XL) 150 MG 24 hr tablet Take 150 mg by mouth daily.  12/19/19  [provider]  RaNITidine HCl (ZANTAC PO) Take by mouth.  12/19/19  [provider]    Family History Family History  Problem Relation Age of Onset  . Diabetes Mother   . COPD Mother   . Emphysema Father     Social History Social History   Tobacco Use  . Smoking status: Former Smoker    Quit date: 02/25/1984    Years since quitting: 35.8  . Smokeless tobacco: Never Used  Vaping Use  . Vaping Use: Never used  Substance Use Topics  . Alcohol use: No  . Drug use: No     Allergies     Patient has no known allergies.   Review of Systems Review of Systems Pertinent items noted in HPI and remainder of comprehensive ROS otherwise negative.   Physical Exam Triage Vital Signs ED Triage Vitals  Enc Vitals Group     BP 12/19/19 0842 (!) 125/62     Pulse Rate 12/19/19 0842 90     Resp 12/19/19 0842 15     Temp 12/19/19 0842 98.1 F (36.7 C)     Temp Source 12/19/19 0842 Oral     SpO2 12/19/19 0842 98 %     Weight 12/19/19 0839 174 lb (78.9 kg)     Height 12/19/19 0839 5\' 5"  (1.651 m)     Head Circumference --      Peak Flow --      Pain Score 12/19/19 0839 8     Pain Loc --      Pain Edu? --      Excl. in GC? --    No data found.  Updated Vital Signs BP (!) 125/62 (BP Location: Left Arm)   Pulse 90   Temp 98.1 F (36.7 C) (Oral)   Resp 15   Ht 5\' 5"  (1.651 m)   Wt 78.9 kg   SpO2 98%   BMI 28.96 kg/m    Physical Exam Vitals reviewed.  Constitutional:      General: She is not in acute distress.    Appearance: She is well-developed.  HENT:     Head: Normocephalic and atraumatic.  Eyes:     General: No scleral icterus.    Pupils: Pupils are equal, round, and reactive to light.  Cardiovascular:     Rate and Rhythm: Normal rate and regular rhythm.  Pulmonary:     Effort: Pulmonary effort is normal.  Abdominal:     General: There is no distension.  Musculoskeletal:       Arms:     Cervical back: Normal range of motion and neck supple.     Comments: Right shoulder: No deformity.  Range of motion intact but painful to fully abduct.  Moderately tender over right biceps and right bicipital tendon, exacerbated by flexing elbow and contracting biceps.  Also tender diffusely right shoulder.  Negative empty can sign.  No skin change or open wound. Right elbow: No deformity.  Range of motion intact.  Moderately tender over medial epicondyle, but nontender over lateral epicondyles.  Neurovascular distally intact.  Skin:    General: Skin is warm and dry.      Capillary Refill: Capillary refill takes less than 2 seconds.     Findings: No bruising or lesion.  Neurological:     Mental Status: She is alert and oriented to person, place, and time.  Cranial Nerves: No cranial nerve deficit.     Sensory: No sensory deficit.     Motor: No weakness.  Psychiatric:        Behavior: Behavior normal.      UC Treatments / Results  Labs (all labs ordered are listed, but only abnormal results are displayed) Labs Reviewed - No data to display     Radiology DG Shoulder Right  Result Date: 12/19/2019 CLINICAL DATA:  Right shoulder and elbow pain for 1 month. EXAM: RIGHT SHOULDER - 2+ VIEW COMPARISON:  None. FINDINGS: There is no evidence of fracture or dislocation. There is no evidence of arthropathy or other focal bone abnormality. Soft tissues are unremarkable. IMPRESSION: Negative. Electronically Signed   By: Signa Kell M.D.   On: 12/19/2019 10:01   DG Elbow Complete Right  Result Date: 12/19/2019 CLINICAL DATA:  64 year old female with history of right shoulder and elbow pain for 1 month, worsened today. EXAM: RIGHT ELBOW - COMPLETE 3+ VIEW COMPARISON:  No priors. FINDINGS: There is no evidence of fracture, dislocation, or joint effusion. Mild degenerative changes of osteoarthritis. Soft tissues are unremarkable. IMPRESSION: 1. No acute radiographic abnormality of the right elbow. 2. Mild osteoarthritis. Electronically Signed   By: Trudie Reed M.D.   On: 12/19/2019 10:02    Procedures Procedures (including critical care time)  Medications Ordered in UC Medications - No data to display  Initial Impression / Assessment and Plan / UC Course  I have reviewed the triage vital signs and the nursing notes.  Pertinent labs & imaging results that were available during my care of the patient were reviewed by me and considered in my medical decision making (see chart for details).     Reviewed x-rays above.  Right shoulder negative.   Right elbow negative except mild osteoarthritis degenerative changes.   Final Clinical Impressions(s) / UC Diagnoses   Final diagnoses:  Acute pain of right shoulder  Tendinitis of right elbow     Discharge Instructions     Your right shoulder pain could be from strain/tendinitis from the injury a month or so ago.  X-ray right shoulder today negative.  Pain right elbow could be tendinitis or strain.  X-ray right elbow today is negative except mild arthritis. Treatment today: As you said that cannot tolerate Tylenol or NSAIDs, I am prescribing a small amount of tramadol, take 1 at bedtime for severe pain.  Ace bandage right elbow and right sling applied.  Heat.  In a few days may start passive range of motion exercises right elbow and right shoulder. Follow-up with Delbert Harness, your orthopedist, within 1 week.    Precautions discussed. Red flags discussed. Questions invited and answered. Patient voiced understanding and agreement.   ED Prescriptions    Medication Sig Dispense Auth. Provider   traMADol (ULTRAM) 50 MG tablet Take 1 tablet (50 mg total) by mouth at bedtime. As needed for severe pain. 5 tablet Lajean Manes, MD     I have reviewed the PDMP during this encounter.   Lajean Manes, MD 12/20/19 [place]

## 2019-12-19 NOTE — Discharge Instructions (Addendum)
Your right shoulder pain could be from strain/tendinitis from the injury a month or so ago.  X-ray right shoulder today negative.  Pain right elbow could be tendinitis or strain.  X-ray right elbow today is negative except mild arthritis. Treatment today: As you said that cannot tolerate Tylenol or NSAIDs, I am prescribing a small amount of tramadol, take 1 at bedtime for severe pain.  Ace bandage right elbow and right sling applied.  Heat.  In a few days may start passive range of motion exercises right elbow and right shoulder. Follow-up with Delbert Harness, your orthopedist, within 1 week.

## 2019-12-19 NOTE — ED Triage Notes (Signed)
Pain to upper right arm x 1 month - had tennis elbow last year - went to PT - was getting better Ice improves it Tylenol arthritis OTC  medicine helped, but was bad for her IBS No meds today -current pain is an 8/10 Tylenol refused in triage Increased pain over the day when at work -pt is on computer for work

## 2019-12-25 ENCOUNTER — Other Ambulatory Visit: Payer: Self-pay

## 2019-12-25 ENCOUNTER — Ambulatory Visit: Payer: Managed Care, Other (non HMO) | Admitting: Sports Medicine

## 2019-12-25 DIAGNOSIS — M7541 Impingement syndrome of right shoulder: Secondary | ICD-10-CM | POA: Insufficient documentation

## 2019-12-25 MED ORDER — MELOXICAM 15 MG PO TABS
ORAL_TABLET | ORAL | 3 refills | Status: DC
Start: 2019-12-25 — End: 2020-03-26

## 2019-12-25 NOTE — Assessment & Plan Note (Signed)
Lindsey Trujillo is a pleasant 64 year old female, she has a long history of right shoulder pain with radiation into the upper arm, worse with overhead activities, no trauma, no mechanical symptoms. Positive Neer's, Hawkins, empty can signs on exam without much weakness. Adding meloxicam, home rehab exercises, return to see me in 6 weeks, injection if no better.

## 2019-12-25 NOTE — Progress Notes (Signed)
    Procedures performed today:    None.  Independent interpretation of notes and tests performed by another provider:   None.  Brief History, Exam, Impression, and Recommendations:    Impingement syndrome, shoulder, right Lindsey Trujillo is a pleasant 64 year old female, she has a long history of right shoulder pain with radiation into the upper arm, worse with overhead activities, no trauma, no mechanical symptoms. Positive Neer's, Hawkins, empty can signs on exam without much weakness. Adding meloxicam, home rehab exercises, return to see me in 6 weeks, injection if no better.    ___________________________________________ Ihor Lynam. Benjamin Stain, M.D., ABFM., CAQSM. Primary Care and Sports Medicine [place] [facility]  Adjunct Instructor of Family Medicine  [employer]

## 2020-02-03 ENCOUNTER — Ambulatory Visit: Payer: Managed Care, Other (non HMO) | Admitting: Sports Medicine

## 2020-02-03 ENCOUNTER — Ambulatory Visit (INDEPENDENT_AMBULATORY_CARE_PROVIDER_SITE_OTHER): Payer: Managed Care, Other (non HMO)

## 2020-02-03 ENCOUNTER — Other Ambulatory Visit: Payer: Self-pay

## 2020-02-03 DIAGNOSIS — M7541 Impingement syndrome of right shoulder: Secondary | ICD-10-CM

## 2020-02-03 NOTE — Assessment & Plan Note (Signed)
This is a very pleasant 64 year old female with a long history of right shoulder pain, radiation into the upper arm and worse with overhead activities. Clinically she had a positive Neer's, Hawkins, empty can signs, no weakness. Today she did have a positive speeds test as well. She failed conservative treatments over the last 6 weeks we did a subacromial injection with ultrasound guidance, return to see me in a month, she will likely need a biceps sheath injection as well if not fully better.

## 2020-02-03 NOTE — Progress Notes (Signed)
    Procedures performed today:    Procedure: Real-time Ultrasound Guided injection of the right subacromial bursa Device: Samsung HS60  Verbal informed consent obtained.  Time-out conducted.  Noted no overlying erythema, induration, or other signs of local infection.  Skin prepped in a sterile fashion.  Local anesthesia: Topical Ethyl chloride.  With sterile technique and under real time ultrasound guidance: 1 cc Kenalog 40, 1 cc lidocaine, 1 cc bupivacaine injected easily Completed without difficulty  Pain immediately resolved suggesting accurate placement of the medication.  Advised to call if fevers/chills, erythema, induration, drainage, or persistent bleeding.  Images permanently stored and available for review in PACS.  Impression: Technically successful ultrasound guided injection.  ___________________________________________ [name]. Benjamin Stain, M.D., ABFM., CAQSM. Primary Care and Sports Medicine [place] [facility]   Adjunct Instructor of Family Medicine  [employer]  Independent interpretation of notes and tests performed by another provider:   None.  Brief History, Exam, Impression, and Recommendations:    Impingement syndrome, shoulder, right This is a very pleasant 64 year old female with a long history of right shoulder pain, radiation into the upper arm and worse with overhead activities. Clinically she had a positive Neer's, Hawkins, empty can signs, no weakness. Today she did have a positive speeds test as well. She failed conservative treatments over the last 6 weeks we did a subacromial injection with ultrasound guidance, return to see me in a month, she will likely need a biceps sheath injection as well if not fully better.    ___________________________________________ [name]. Benjamin Stain, M.D., ABFM., CAQSM. Primary Care and Sports Medicine [place] [facility]  Adjunct  Instructor of Family Medicine  [employer] [facility] [employer] [facility] [employer]

## 2020-02-05 ENCOUNTER — Ambulatory Visit: Payer: Managed Care, Other (non HMO) | Admitting: Sports Medicine

## 2020-03-04 ENCOUNTER — Ambulatory Visit (INDEPENDENT_AMBULATORY_CARE_PROVIDER_SITE_OTHER): Payer: Managed Care, Other (non HMO) | Admitting: Sports Medicine

## 2020-03-04 DIAGNOSIS — M7541 Impingement syndrome of right shoulder: Secondary | ICD-10-CM

## 2020-03-04 NOTE — Assessment & Plan Note (Signed)
Lindsey Trujillo is a pleasant 64 year old female with impingement syndrome, we did a subacromial injection at the last visit she returns today essentially pain-free. She will continue her conditioning exercises and return to see me on an as-needed basis.

## 2020-03-04 NOTE — Progress Notes (Signed)
    Procedures performed today:    None.  Independent interpretation of notes and tests performed by another provider:   None.  Brief History, Exam, Impression, and Recommendations:    Impingement syndrome, shoulder, right Lindsey Trujillo is a pleasant 64 year old female with impingement syndrome, we did a subacromial injection at the last visit she returns today essentially pain-free. She will continue her conditioning exercises and return to see me on an as-needed basis.    ___________________________________________ Ihor Prieur. Benjamin Stain, M.D., ABFM., CAQSM. Primary Care and Sports Medicine [place] [facility]  Adjunct Instructor of Family Medicine  [employer]

## 2020-03-26 ENCOUNTER — Other Ambulatory Visit: Payer: Self-pay | Admitting: Sports Medicine

## 2020-03-26 DIAGNOSIS — M7541 Impingement syndrome of right shoulder: Secondary | ICD-10-CM

## 2020-08-11 ENCOUNTER — Other Ambulatory Visit: Payer: Self-pay | Admitting: Sports Medicine

## 2020-08-11 DIAGNOSIS — M7541 Impingement syndrome of right shoulder: Secondary | ICD-10-CM

## 2020-12-15 ENCOUNTER — Other Ambulatory Visit: Payer: Self-pay | Admitting: Sports Medicine

## 2020-12-15 DIAGNOSIS — M7541 Impingement syndrome of right shoulder: Secondary | ICD-10-CM

## 2021-04-23 ENCOUNTER — Other Ambulatory Visit: Payer: Self-pay | Admitting: Sports Medicine

## 2021-04-23 DIAGNOSIS — M7541 Impingement syndrome of right shoulder: Secondary | ICD-10-CM

## 2021-08-08 ENCOUNTER — Ambulatory Visit: Payer: Managed Care, Other (non HMO) | Admitting: Sports Medicine

## 2021-08-08 ENCOUNTER — Other Ambulatory Visit: Payer: Self-pay

## 2021-08-08 ENCOUNTER — Ambulatory Visit (INDEPENDENT_AMBULATORY_CARE_PROVIDER_SITE_OTHER): Payer: Managed Care, Other (non HMO)

## 2021-08-08 DIAGNOSIS — M25551 Pain in right hip: Secondary | ICD-10-CM | POA: Diagnosis not present

## 2021-08-08 DIAGNOSIS — M545 Low back pain, unspecified: Secondary | ICD-10-CM | POA: Diagnosis not present

## 2021-08-08 DIAGNOSIS — G8929 Other chronic pain: Secondary | ICD-10-CM

## 2021-08-08 MED ORDER — PREDNISONE 50 MG PO TABS: ORAL_TABLET | ORAL | 0 refills | Status: DC

## 2021-08-08 NOTE — Progress Notes (Signed)
? ? ?  Procedures performed today:   ? ?None. ? ?Independent interpretation of notes and tests performed by another provider:  ? ?None. ? ?Brief History, Exam, Impression, and Recommendations:   ? ?Chronic right-sided low back pain without sciatica ?This is a very pleasant 66 year old female, she had a several day history of increasing low back pain, right-sided, localized at the sacroiliac joint, not worse with sitting, flexion, Valsalva. ?Radiates into the buttock, lateral thigh without discomfort to palpation over the trochanteric bursa. ?Hip motion is good. ?Nothing radicular down past the knee. ?Suspect SI joint pain generator, adding x-rays, prednisone, SI joint condition, return to see me in 6 weeks, SI joint injection with ultrasound guidance if not better. ? ?Chronic process with exacerbation and pharmacologic intervention ? ?___________________________________________ ?Ihor Beverlin. Benjamin Stain, M.D., ABFM., CAQSM. ?Primary Care and Sports Medicine ?[place] [facility] ? ?Adjunct Instructor of Family Medicine  ?[employer] ?

## 2021-08-08 NOTE — Assessment & Plan Note (Signed)
This is a very pleasant 66 year old female, she had a several day history of increasing low back pain, right-sided, localized at the sacroiliac joint, not worse with sitting, flexion, Valsalva. ?Radiates into the buttock, lateral thigh without discomfort to palpation over the trochanteric bursa. ?Hip motion is good. ?Nothing radicular down past the knee. ?Suspect SI joint pain generator, adding x-rays, prednisone, SI joint condition, return to see me in 6 weeks, SI joint injection with ultrasound guidance if not better. ?

## 2021-08-09 ENCOUNTER — Encounter: Payer: Self-pay | Admitting: Sports Medicine

## 2021-08-09 MED ORDER — TRAMADOL HCL 50 MG PO TABS: 50.0000 mg | ORAL_TABLET | Freq: Three times a day (TID) | ORAL | 0 refills | Status: DC | PRN

## 2021-09-19 ENCOUNTER — Ambulatory Visit: Payer: Managed Care, Other (non HMO) | Admitting: Sports Medicine

## 2021-10-11 ENCOUNTER — Ambulatory Visit (INDEPENDENT_AMBULATORY_CARE_PROVIDER_SITE_OTHER): Payer: Managed Care, Other (non HMO)

## 2021-10-11 ENCOUNTER — Ambulatory Visit: Payer: Managed Care, Other (non HMO) | Admitting: Sports Medicine

## 2021-10-11 DIAGNOSIS — G8929 Other chronic pain: Secondary | ICD-10-CM | POA: Diagnosis not present

## 2021-10-11 DIAGNOSIS — M545 Low back pain, unspecified: Secondary | ICD-10-CM

## 2021-10-11 MED ORDER — TRAMADOL HCL 50 MG PO TABS: 50.0000 mg | ORAL_TABLET | Freq: Two times a day (BID) | ORAL | 3 refills | Status: DC | PRN

## 2021-10-11 NOTE — Assessment & Plan Note (Addendum)
Lindsey Trujillo returns, she is a very pleasant 66 year old female, she has severe right-sided low back pain localized at the sacroiliac joint. ?Tenderness is reproducible at the sacroiliac joint. ?X-rays did show significant lumbar degenerative processes. ?Due to failure of prednisone, tramadol we have injected her SI joint today, return to see me in a month, refilling tramadol for use as needed. ?

## 2021-10-11 NOTE — Progress Notes (Signed)
? ? ?  Procedures performed today:   ? ?Procedure: Real-time Ultrasound Guided injection of the right sacroiliac joint ?Device: Samsung HS60  ?Verbal informed consent obtained.  ?Time-out conducted.  ?Noted no overlying erythema, induration, or other signs of local infection.  ?Skin prepped in a sterile fashion.  ?Local anesthesia: Topical Ethyl chloride.  ?With sterile technique and under real time ultrasound guidance: Noted intact joint, 1 cc Kenalog 40, 2 cc lidocaine, 2 cc bupivacaine injected easily ?Completed without difficulty  ?Advised to call if fevers/chills, erythema, induration, drainage, or persistent bleeding.  ?Images permanently stored and available for review in PACS.  ?Impression: Technically successful ultrasound guided injection. ? ?Independent interpretation of notes and tests performed by another provider:  ? ?None. ? ?Brief History, Exam, Impression, and Recommendations:   ? ?Chronic right-sided low back pain without sciatica ?Lindsey Trujillo returns, she is a very pleasant 66 year old female, she has severe right-sided low back pain localized at the sacroiliac joint. ?Tenderness is reproducible at the sacroiliac joint. ?X-rays did show significant lumbar degenerative processes. ?Due to failure of prednisone, tramadol we have injected her SI joint today, return to see me in a month, refilling tramadol for use as needed. ? ?Chronic process with exacerbation and pharmacologic intervention ? ?___________________________________________ ?Ihor Mayorquin. Benjamin Stain, M.D., ABFM., CAQSM. ?Primary Care and Sports Medicine ?[place] [facility] ? ?Adjunct Instructor of Family Medicine  ?[employer] ?

## 2021-11-22 ENCOUNTER — Ambulatory Visit: Payer: Managed Care, Other (non HMO) | Admitting: Sports Medicine

## 2022-04-04 IMAGING — DX DG SI JOINTS 3+V
3 series · 3 of 3 positions shown · non-contrast
Comparison: None

CLINICAL DATA: Right sacroiliac joint pain radiating into the right
buttock and lateral right thigh.

EXAM:
BILATERAL SACROILIAC JOINTS - 3+ VIEW

[si joints ap]
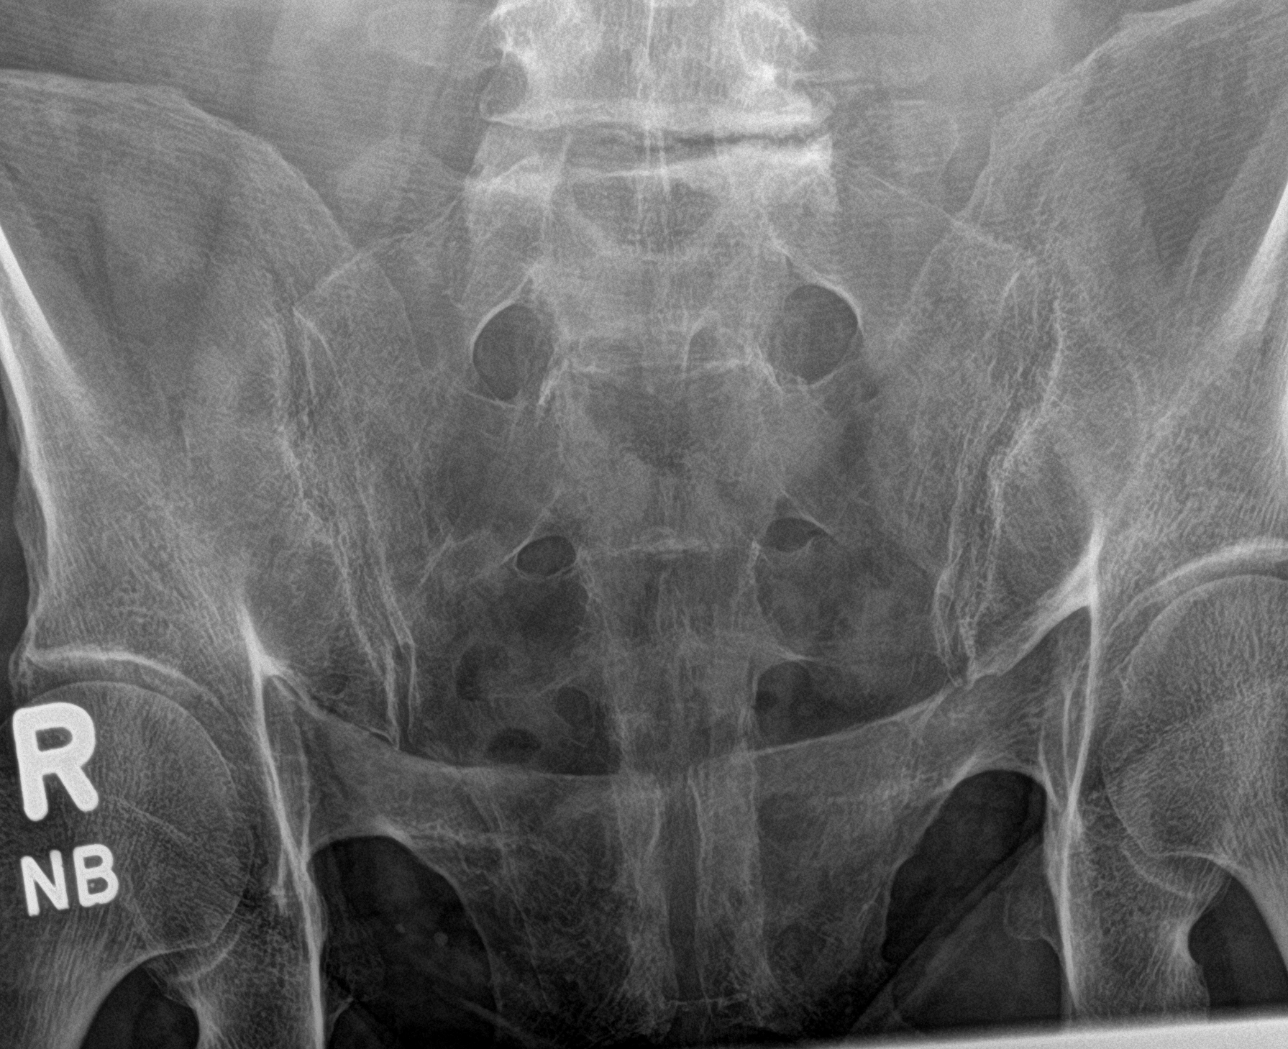

[si joints obl (1 of 2)]
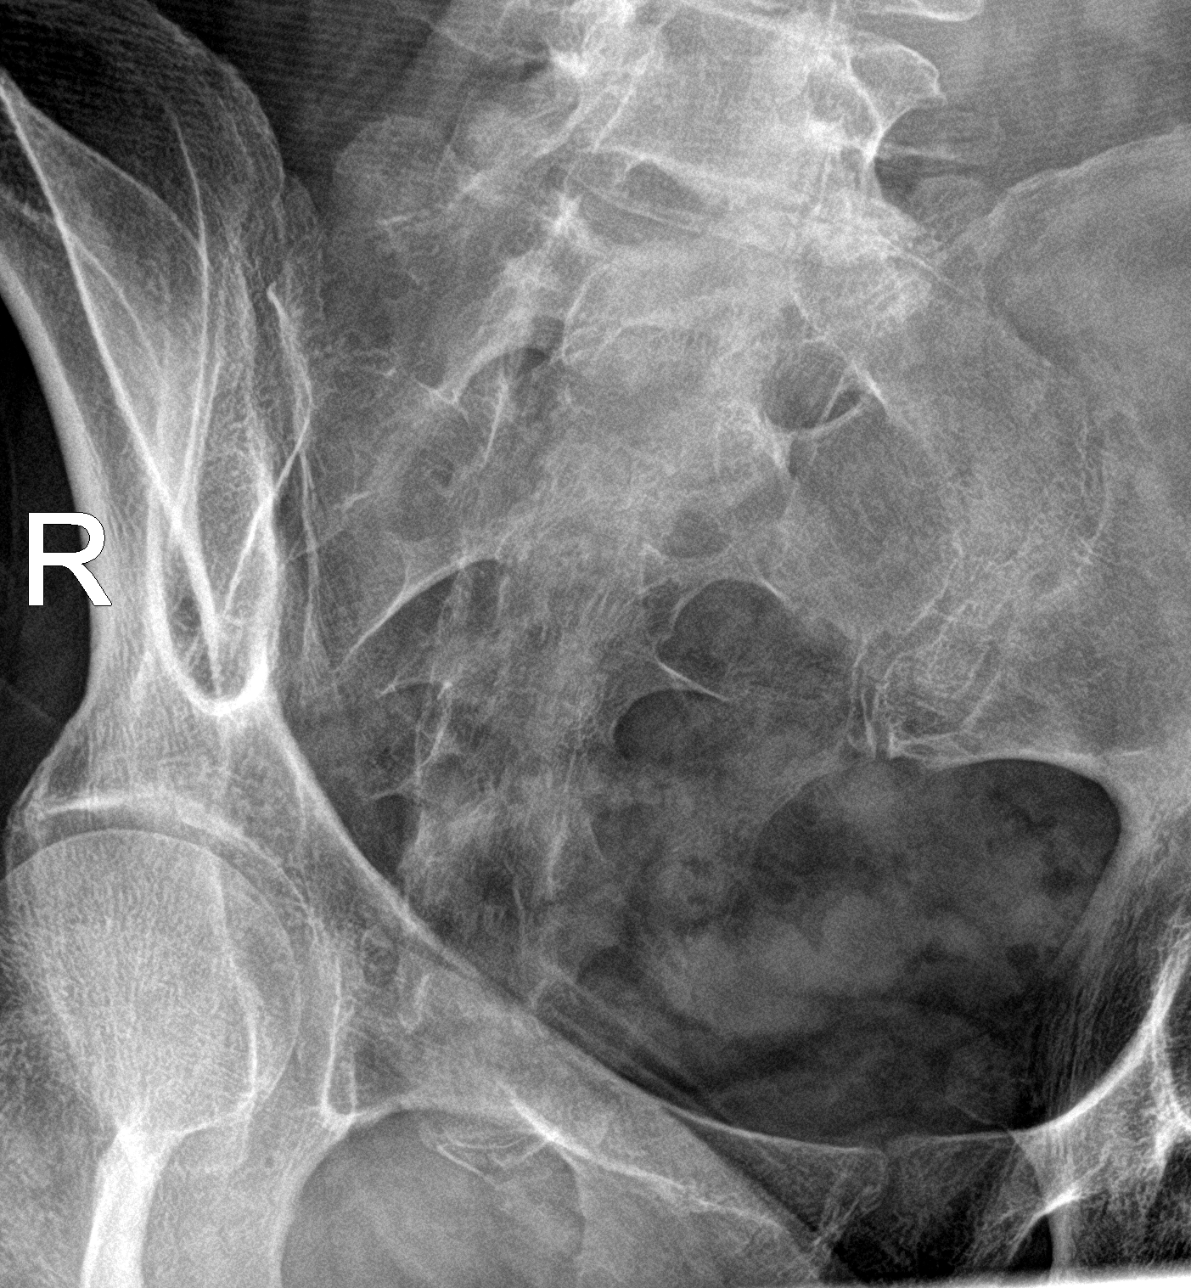

[si joints obl (2 of 2)]
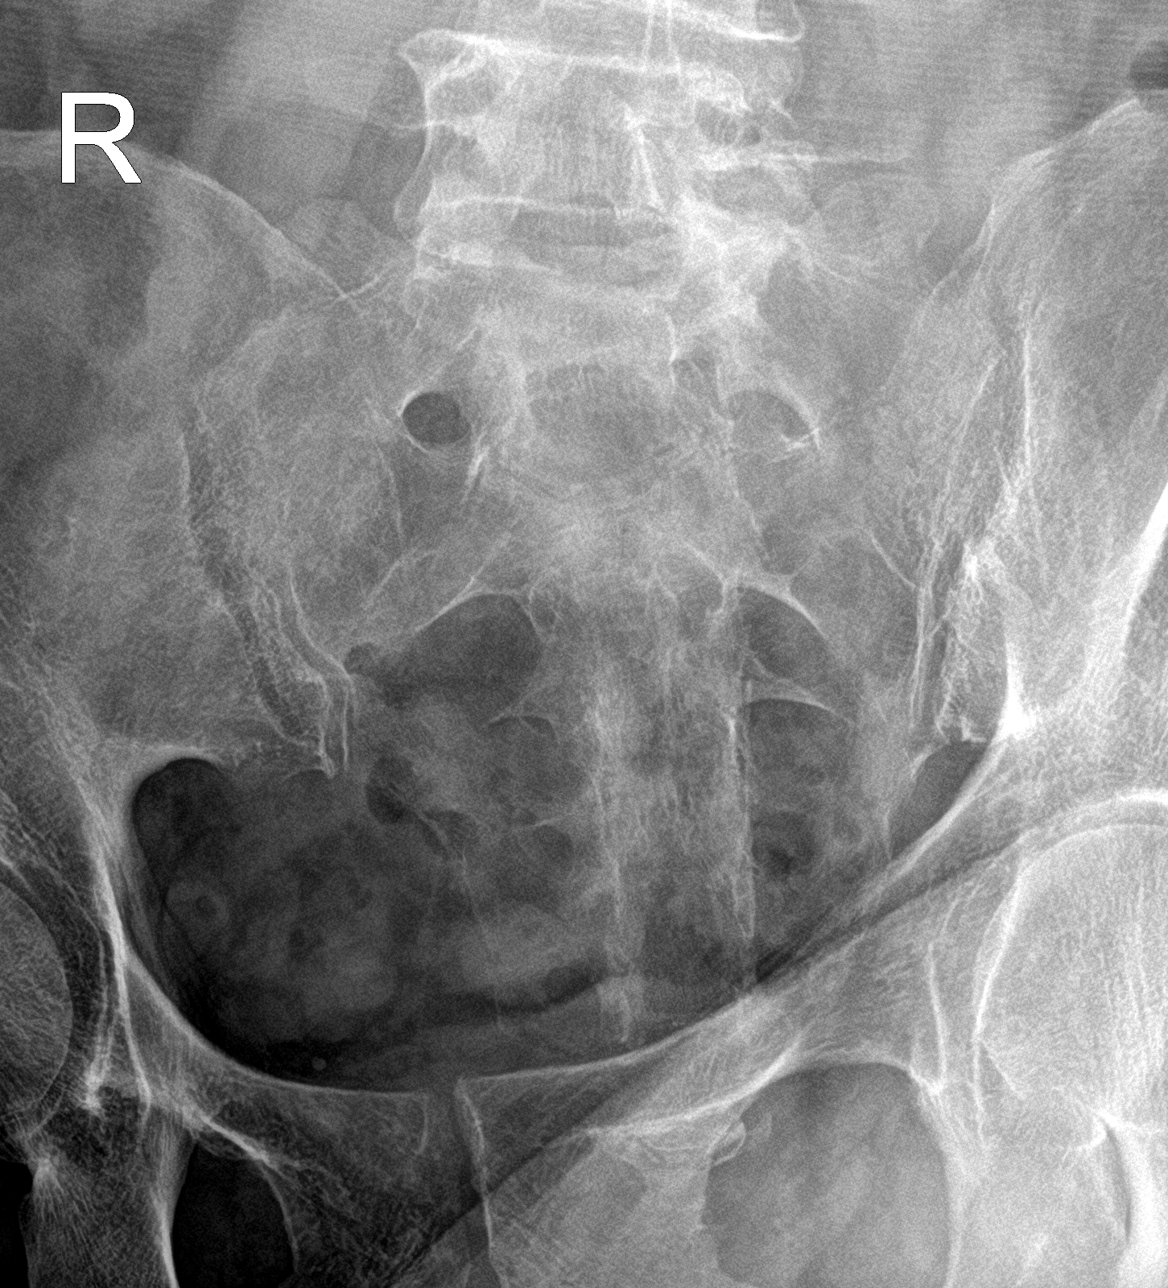

[3 of 3 positions shown; findings below may reference images not displayed]

FINDINGS: The bilateral sacroiliac joint spaces are maintained. Severe left
L4-5 disc space narrowing with endplate sclerosis and peripheral
osteophytosis.

No acute fracture or dislocation.
IMPRESSION: The bilateral sacroiliac joint spaces are maintained.

## 2022-04-11 ENCOUNTER — Other Ambulatory Visit: Payer: Self-pay | Admitting: Sports Medicine

## 2022-04-11 DIAGNOSIS — G8929 Other chronic pain: Secondary | ICD-10-CM

## 2022-09-06 ENCOUNTER — Ambulatory Visit: Payer: Managed Care, Other (non HMO) | Admitting: Sports Medicine

## 2022-09-06 ENCOUNTER — Ambulatory Visit: Payer: Managed Care, Other (non HMO)

## 2022-09-06 DIAGNOSIS — M545 Low back pain, unspecified: Secondary | ICD-10-CM | POA: Diagnosis not present

## 2022-09-06 DIAGNOSIS — R55 Syncope and collapse: Secondary | ICD-10-CM | POA: Insufficient documentation

## 2022-09-06 DIAGNOSIS — G8929 Other chronic pain: Secondary | ICD-10-CM

## 2022-09-06 MED ORDER — PREDNISONE 50 MG PO TABS: ORAL_TABLET | ORAL | 0 refills | Status: DC

## 2022-09-06 MED ORDER — TRAMADOL HCL 50 MG PO TABS: 50.0000 mg | ORAL_TABLET | Freq: Three times a day (TID) | ORAL | 1 refills | Status: DC | PRN

## 2022-09-06 NOTE — Assessment & Plan Note (Signed)
Suspect menopausal vasomotor instability, can consider Veozah, she will discuss this with her PCP.

## 2022-09-06 NOTE — Progress Notes (Signed)
    Procedures performed today:    None.  Independent interpretation of notes and tests performed by another provider:   None.  Brief History, Exam, Impression, and Recommendations:    Chronic right-sided low back pain without sciatica This is a very pleasant 67 year old female, she has a history of low back pain, axial, right-sided discogenic, historically not radiating down to the leg, about a year ago we did a sacroiliac joint injection, she did really well. Now having recurrence of pain, worse with sitting, flexion, Valsalva with right-sided radicular symptoms to the top of the right foot. Suspect right-sided L5 radiculopathy, adding prednisone, home physical therapy, updated x-rays, return to see me in about 6 weeks, MR for interventional planning if no better. Also refilling tramadol for use 3 times daily.  Vasomotor instability Suspect menopausal vasomotor instability, can consider Veozah, she will discuss this with her PCP.    ____________________________________________ Ihor Bogusz. Benjamin Stain, M.D., ABFM., CAQSM., AME. Primary Care and Sports Medicine [place] [facility]  Adjunct Professor of Family Medicine  [employer] [employer] [employer]  Restaurant manager, fast food

## 2022-09-06 NOTE — Assessment & Plan Note (Signed)
This is a very pleasant 67 year old female, she has a history of low back pain, axial, right-sided discogenic, historically not radiating down to the leg, about a year ago we did a sacroiliac joint injection, she did really well. Now having recurrence of pain, worse with sitting, flexion, Valsalva with right-sided radicular symptoms to the top of the right foot. Suspect right-sided L5 radiculopathy, adding prednisone, home physical therapy, updated x-rays, return to see me in about 6 weeks, MR for interventional planning if no better. Also refilling tramadol for use 3 times daily.

## 2022-09-07 ENCOUNTER — Encounter: Payer: Self-pay | Admitting: Sports Medicine

## 2022-09-08 ENCOUNTER — Telehealth: Payer: Self-pay

## 2022-09-08 NOTE — Telephone Encounter (Addendum)
Initiated Prior authorization FYT:[id number] HCl 50MG  tablets Via: Covermymeds Case/Key:[id number] Status: approved  as of 09/08/22 Reason:approved through 03/10/2023. Notified Pt via: Mychart  Pt has optum rx

## 2022-09-12 ENCOUNTER — Ambulatory Visit: Payer: Managed Care, Other (non HMO) | Admitting: Sports Medicine

## 2022-09-12 ENCOUNTER — Encounter: Payer: Self-pay | Admitting: Sports Medicine

## 2022-09-12 DIAGNOSIS — G8929 Other chronic pain: Secondary | ICD-10-CM | POA: Diagnosis not present

## 2022-09-12 DIAGNOSIS — M545 Low back pain, unspecified: Secondary | ICD-10-CM

## 2022-09-12 MED ORDER — GABAPENTIN 300 MG PO CAPS
ORAL_CAPSULE | ORAL | 3 refills | Status: DC
Start: 2022-09-12 — End: 2023-05-28

## 2022-09-12 MED ORDER — HYDROCODONE-ACETAMINOPHEN 10-325 MG PO TABS: 1.0000 | ORAL_TABLET | Freq: Three times a day (TID) | ORAL | 0 refills | Status: DC | PRN

## 2022-09-12 NOTE — Progress Notes (Signed)
    Procedures performed today:    None.  Independent interpretation of notes and tests performed by another provider:   None.  Brief History, Exam, Impression, and Recommendations:    Chronic right-sided low back pain without sciatica Lindsey Trujillo returns, she is a very pleasant 67 year old female, long history of axial low back pain with right-sided discogenic symptoms, she had an SI joint injection about a year ago and did well. More recently she had a recurrence of pain, worse with sitting, flexion, Valsalva with radicular symptoms to the top of the right foot consistent with a right L5 radiculopathy. We did prednisone, home physical therapy, unfortunately this has not improved her symptoms, tramadol ineffective for pain. Increasing to hydrocodone tens, Neurontin, I had still like her to continue her home conditioning and and still like to see her back in 4 to 5 weeks for MRI and epidural planning.    ____________________________________________ Ihor Marschke. Benjamin Stain, M.D., ABFM., CAQSM., AME. Primary Care and Sports Medicine  [facility]  Adjunct Professor of Family Medicine  [place] [employer] [facility] [employer]  Restaurant manager, fast food

## 2022-09-12 NOTE — Assessment & Plan Note (Signed)
Lindsey Trujillo returns, she is a very pleasant 67 year old female, long history of axial low back pain with right-sided discogenic symptoms, she had an SI joint injection about a year ago and did well. More recently she had a recurrence of pain, worse with sitting, flexion, Valsalva with radicular symptoms to the top of the right foot consistent with a right L5 radiculopathy. We did prednisone, home physical therapy, unfortunately this has not improved her symptoms, tramadol ineffective for pain. Increasing to hydrocodone tens, Neurontin, I had still like her to continue her home conditioning and and still like to see her back in 4 to 5 weeks for MRI and epidural planning.

## 2022-09-20 ENCOUNTER — Encounter: Payer: Self-pay | Admitting: Sports Medicine

## 2022-10-18 ENCOUNTER — Ambulatory Visit: Payer: Managed Care, Other (non HMO) | Admitting: Sports Medicine

## 2022-10-18 DIAGNOSIS — M545 Low back pain, unspecified: Secondary | ICD-10-CM

## 2022-10-18 DIAGNOSIS — G8929 Other chronic pain: Secondary | ICD-10-CM | POA: Diagnosis not present

## 2022-10-18 NOTE — Assessment & Plan Note (Addendum)
This is a pleasant 67 year old female, long history of axial low back pain that responded well to a SI joint injection about a year ago, more recently her pain is discogenic with radiation down the right leg in an L5 distribution to the top of the foot, she will pay close attention over the next several days as to which toes it goes to, and we will go ahead and proceed with an MRI for epidural planning. She at this point has failed steroids, physical therapy, hydrocodone, Neurontin. She would like me to order the epidural as soon as I see her MRI results, and we can do a virtual visit next week to just go over the images.  Update: Multiple disc protrusion, it does look like the right L5 nerve is affected, ordering a right L5-S1 transforaminal epidural.

## 2022-10-18 NOTE — Progress Notes (Addendum)
    Procedures performed today:    None.  Independent interpretation of notes and tests performed by another provider:   None.  Brief History, Exam, Impression, and Recommendations:    Chronic right-sided low back pain without sciatica This is a pleasant 67 year old female, long history of axial low back pain that responded well to a SI joint injection about a year ago, more recently her pain is discogenic with radiation down the right leg in an L5 distribution to the top of the foot, she will pay close attention over the next several days as to which toes it goes to, and we will go ahead and proceed with an MRI for epidural planning. She at this point has failed steroids, physical therapy, hydrocodone, Neurontin. She would like me to order the epidural as soon as I see her MRI results, and we can do a virtual visit next week to just go over the images.  Update: Multiple disc protrusion, it does look like the right L5 nerve is affected, ordering a right L5-S1 transforaminal epidural.    ____________________________________________ Ihor Scism. Benjamin Stain, M.D., ABFM., CAQSM., AME. Primary Care and Sports Medicine  [facility]  Adjunct Professor of Family Medicine  [employer]  Restaurant manager, fast food

## 2022-10-19 ENCOUNTER — Ambulatory Visit: Payer: Managed Care, Other (non HMO) | Admitting: Sports Medicine

## 2022-10-19 ENCOUNTER — Encounter: Payer: Self-pay | Admitting: Sports Medicine

## 2022-10-19 NOTE — Telephone Encounter (Signed)
I think Lindsey Trujillo is usually able to get approvals very quickly and we certainly have openings to schedule it.  No need to change the urgency but I will route this to [place] to make sure that she is working on the authorization.

## 2022-10-19 NOTE — Telephone Encounter (Signed)
The prior authorization has already been sent to the insurance company.

## 2022-10-23 ENCOUNTER — Encounter: Payer: Self-pay | Admitting: Sports Medicine

## 2022-10-24 NOTE — Telephone Encounter (Signed)
Task completed. Patient has been scheduled to complete the MRI on Saturday.

## 2022-10-27 ENCOUNTER — Ambulatory Visit: Payer: Managed Care, Other (non HMO)

## 2022-10-27 DIAGNOSIS — G8929 Other chronic pain: Secondary | ICD-10-CM

## 2022-10-27 DIAGNOSIS — M545 Low back pain, unspecified: Secondary | ICD-10-CM | POA: Diagnosis not present

## 2022-11-01 ENCOUNTER — Encounter: Payer: Self-pay | Admitting: Sports Medicine

## 2022-11-01 NOTE — Telephone Encounter (Signed)
[facility] and they will move her up on the list.

## 2022-11-01 NOTE — Addendum Note (Signed)
Addended by: Monica Becton on: 11/01/2022 04:31 PM   Modules accepted: Orders

## 2022-11-13 ENCOUNTER — Other Ambulatory Visit: Payer: Managed Care, Other (non HMO)

## 2022-11-15 NOTE — Discharge Instructions (Signed)

## 2022-11-16 ENCOUNTER — Ambulatory Visit
Admission: RE | Admit: 2022-11-16 | Discharge: 2022-11-16 | Disposition: A | Discharge status: Home / Self Care | Payer: Managed Care, Other (non HMO) | Source: Ambulatory Visit | Attending: Sports Medicine | Admitting: Sports Medicine

## 2022-11-16 DIAGNOSIS — M545 Low back pain, unspecified: Secondary | ICD-10-CM

## 2022-11-16 MED ORDER — IOPAMIDOL (ISOVUE-M 200) INJECTION 41%
1.0000 mL | Freq: Once | INTRAMUSCULAR | Status: AC
  Administered 2022-11-16: 1 mL via EPIDURAL

## 2022-11-16 MED ORDER — METHYLPREDNISOLONE ACETATE 40 MG/ML INJ SUSP (RADIOLOG
80.0000 mg | Freq: Once | INTRAMUSCULAR | Status: AC
  Administered 2022-11-16: 80 mg via EPIDURAL

## 2023-05-20 ENCOUNTER — Encounter (INDEPENDENT_AMBULATORY_CARE_PROVIDER_SITE_OTHER): Payer: Managed Care, Other (non HMO) | Admitting: Sports Medicine

## 2023-05-20 DIAGNOSIS — M25569 Pain in unspecified knee: Secondary | ICD-10-CM

## 2023-05-20 DIAGNOSIS — G8929 Other chronic pain: Secondary | ICD-10-CM

## 2023-05-20 MED ORDER — TRAMADOL HCL 50 MG PO TABS: 50.0000 mg | ORAL_TABLET | Freq: Three times a day (TID) | ORAL | 0 refills | Status: DC | PRN

## 2023-05-20 NOTE — Telephone Encounter (Signed)

## 2023-05-28 ENCOUNTER — Ambulatory Visit (INDEPENDENT_AMBULATORY_CARE_PROVIDER_SITE_OTHER): Payer: Managed Care, Other (non HMO)

## 2023-05-28 ENCOUNTER — Ambulatory Visit: Payer: Managed Care, Other (non HMO) | Admitting: Sports Medicine

## 2023-05-28 ENCOUNTER — Ambulatory Visit: Payer: Managed Care, Other (non HMO)

## 2023-05-28 DIAGNOSIS — M1711 Unilateral primary osteoarthritis, right knee: Secondary | ICD-10-CM

## 2023-05-28 DIAGNOSIS — M1712 Unilateral primary osteoarthritis, left knee: Secondary | ICD-10-CM

## 2023-05-28 NOTE — Progress Notes (Signed)
    Procedures performed today:    Procedure: Real-time Ultrasound Guided injection of the right knee Device: Samsung HS60  Verbal informed consent obtained.  Time-out conducted.  Noted no overlying erythema, induration, or other signs of local infection.  Skin prepped in a sterile fashion.  Local anesthesia: Topical Ethyl chloride.  With sterile technique and under real time ultrasound guidance: Trace effusion noted ,1 cc Kenalog  40, 2 cc lidocaine, 2 cc bupivacaine injected easily Completed without difficulty  Advised to call if fevers/chills, erythema, induration, drainage, or persistent bleeding.  Images permanently stored and available for review in PACS.  Impression: Technically successful ultrasound guided injection.  Independent interpretation of notes and tests performed by another provider:   None.  Brief History, Exam, Impression, and Recommendations:    Primary osteoarthritis of right knee Very pleasant 67 year old female, increasing pain right knee medial joint line, oral analgesics including tramadol , ibuprofen, Tylenol  not effective. She tried some topical Voltaren not sufficiently effective. Today we injected her knee, we will add x-rays, home PT, she will continue topical Voltaren 4 times daily. Return to see me in 6 weeks.    ____________________________________________ Debby PARAS. Curtis, M.D., ABFM., CAQSM., AME. Primary Care and Sports Medicine [place] [facility]  Adjunct Professor of [facility] Medicine  [employer]  Restaurant Manager, [employer]

## 2023-05-28 NOTE — Assessment & Plan Note (Signed)
 Very pleasant 67 year old female, increasing pain right knee medial joint line, oral analgesics including tramadol , ibuprofen, Tylenol  not effective. She tried some topical Voltaren not sufficiently effective. Today we injected her knee, we will add x-rays, home PT, she will continue topical Voltaren 4 times daily. Return to see me in 6 weeks.

## 2023-05-29 ENCOUNTER — Other Ambulatory Visit: Payer: Self-pay | Admitting: Sports Medicine

## 2023-05-29 DIAGNOSIS — G8929 Other chronic pain: Secondary | ICD-10-CM

## 2023-05-30 DIAGNOSIS — M1711 Unilateral primary osteoarthritis, right knee: Secondary | ICD-10-CM

## 2023-05-30 MED ORDER — TRIAMCINOLONE ACETONIDE 40 MG/ML IJ SUSP
40.0000 mg | Freq: Once | INTRAMUSCULAR | Status: AC
  Administered 2023-05-30: 40 mg via INTRAMUSCULAR

## 2023-05-30 NOTE — Addendum Note (Signed)
 Addended by: Carren Rang A on: 05/30/2023 09:44 AM   Modules accepted: Orders

## 2023-05-30 NOTE — Code Documentation (Signed)
 done

## 2023-07-07 ENCOUNTER — Encounter: Payer: Self-pay | Admitting: Sports Medicine

## 2023-07-09 ENCOUNTER — Ambulatory Visit: Payer: Managed Care, Other (non HMO) | Admitting: Sports Medicine

## 2023-08-13 ENCOUNTER — Encounter: Payer: Self-pay | Admitting: Sports Medicine

## 2023-08-26 ENCOUNTER — Encounter: Payer: Self-pay | Admitting: Sports Medicine

## 2023-08-26 ENCOUNTER — Ambulatory Visit (INDEPENDENT_AMBULATORY_CARE_PROVIDER_SITE_OTHER): Admitting: Sports Medicine

## 2023-08-26 ENCOUNTER — Other Ambulatory Visit (INDEPENDENT_AMBULATORY_CARE_PROVIDER_SITE_OTHER): Payer: Self-pay

## 2023-08-26 ENCOUNTER — Telehealth: Payer: Self-pay | Admitting: Sports Medicine

## 2023-08-26 DIAGNOSIS — M1711 Unilateral primary osteoarthritis, right knee: Secondary | ICD-10-CM | POA: Diagnosis not present

## 2023-08-26 DIAGNOSIS — G8929 Other chronic pain: Secondary | ICD-10-CM

## 2023-08-26 DIAGNOSIS — M25569 Pain in unspecified knee: Secondary | ICD-10-CM | POA: Diagnosis not present

## 2023-08-26 MED ORDER — CELECOXIB 200 MG PO CAPS
ORAL_CAPSULE | ORAL | 2 refills | Status: DC
Start: 2023-08-26 — End: 2023-09-22

## 2023-08-26 MED ORDER — TRAMADOL HCL 50 MG PO TABS: 50.0000 mg | ORAL_TABLET | Freq: Three times a day (TID) | ORAL | 0 refills | Status: DC | PRN

## 2023-08-26 MED ORDER — TRIAMCINOLONE ACETONIDE 40 MG/ML IJ SUSP
40.0000 mg | Freq: Once | INTRAMUSCULAR | Status: AC
Start: 2023-08-26 — End: 2023-08-26
  Administered 2023-08-26: 40 mg via INTRAMUSCULAR

## 2023-08-26 NOTE — Addendum Note (Signed)
 Addended by: Samule Dry on: 08/26/2023 10:32 AM   Modules accepted: Orders

## 2023-08-26 NOTE — Progress Notes (Signed)
 This is a very pleasant 62   Procedures performed today:    Procedure: Real-time Ultrasound Guided injection of the right knee Device: Samsung HS60  Verbal informed consent obtained.  Time-out conducted.  Noted no overlying erythema, induration, or other signs of local infection.  Skin prepped in a sterile fashion.  Local anesthesia: Topical Ethyl chloride.  With sterile technique and under real time ultrasound guidance: Noted trace effusion, 1 cc Kenalog 40, 2 cc lidocaine, 2 cc bupivacaine injected easily Completed without difficulty  Advised to call if fevers/chills, erythema, induration, drainage, or persistent bleeding.  Images permanently stored and available for review in PACS.  Impression: Technically successful ultrasound guided injection.  Independent interpretation of notes and tests performed by another provider:   None.  Brief History, Exam, Impression, and Recommendations:    Primary osteoarthritis of right knee This is a very pleasant 68 year old female, she has known severe knee osteoarthritis, last injection was 3 months ago, now having recurrence of pain and interested in additional modalities. Adding reaction knee brace. We did a repeat steroid injection today, we discussed PRP, viscosupplementation, geniculate artery embolization and knee arthroplasty. We will refill her tramadol, we will add Celebrex if she does get some epigastric discomfort with traditional NSAIDs. We will get her approved for gel and she can return to see me to start Visco when approved.    ____________________________________________ Ihor Laning. Benjamin Stain, M.D., ABFM., CAQSM., AME. Primary Care and Sports Medicine  [facility]  Adjunct Professor of Family Medicine  [place] [employer] [facility] [employer]  Restaurant manager, fast food

## 2023-08-26 NOTE — Telephone Encounter (Signed)
 Visco approval please, right knee x-ray confirmed osteoarthritis, failed oral analgesics, steroid injections, 6 weeks of therapy.

## 2023-08-26 NOTE — Assessment & Plan Note (Signed)
 This is a very pleasant 68 year old female, she has known severe knee osteoarthritis, last injection was 3 months ago, now having recurrence of pain and interested in additional modalities. Adding reaction knee brace. We did a repeat steroid injection today, we discussed PRP, viscosupplementation, geniculate artery embolization and knee arthroplasty. We will refill her tramadol, we will add Celebrex if she does get some epigastric discomfort with traditional NSAIDs. We will get her approved for gel and she can return to see me to start Visco when approved.

## 2023-09-16 ENCOUNTER — Encounter: Payer: Self-pay | Admitting: Sports Medicine

## 2023-09-18 ENCOUNTER — Telehealth: Payer: Self-pay | Admitting: Nurse Practitioner

## 2023-09-18 NOTE — Telephone Encounter (Signed)
 Submitted a PA to [employer] via fax.

## 2023-09-18 NOTE — Telephone Encounter (Unsigned)
 Copied from CRM [id number]. Topic: General - Other >> Sep 18, 2023  1:18 PM Brynn Caras wrote: Reason for CRM: The patient messaged Dr.T through MyChart and has not received a reply, she is wanting an update the  new injection (Rooster Comb) that Dr T and her discussed on 03/31 during her last OV. Callback [phone]

## 2023-09-18 NOTE — Telephone Encounter (Signed)
 Benefits Investigation Details received from MyVisco Injection: Orthovisc PA required: Yes May fill through: Buy and Bill  OV Copay/Coinsurance: % Product Copay: 20% Administration Coinsurance:  Administration Copay: $5 Deductible: $   Benefits Investigation Started  Deductible does not apply. Once OOP has been met, patient is covered at 100%. Only one copay will apply for the date of service.   Prior Authorization is required for the drug through [facility] [employer].  Prior Authorization has been Approved and patient has been notified.

## 2023-09-19 NOTE — Telephone Encounter (Signed)
 Patient was called with an update.

## 2023-09-22 ENCOUNTER — Other Ambulatory Visit: Payer: Self-pay

## 2023-09-22 ENCOUNTER — Ambulatory Visit
Admission: EM | Admit: 2023-09-22 | Discharge: 2023-09-22 | Disposition: A | Discharge status: Home / Self Care | Attending: Family Medicine | Admitting: Family Medicine

## 2023-09-22 DIAGNOSIS — J3089 Other allergic rhinitis: Secondary | ICD-10-CM | POA: Diagnosis not present

## 2023-09-22 DIAGNOSIS — R0981 Nasal congestion: Secondary | ICD-10-CM

## 2023-09-22 DIAGNOSIS — J069 Acute upper respiratory infection, unspecified: Secondary | ICD-10-CM

## 2023-09-22 DIAGNOSIS — R058 Other specified cough: Secondary | ICD-10-CM

## 2023-09-22 MED ORDER — AMOXICILLIN-POT CLAVULANATE 875-125 MG PO TABS: 1.0000 | ORAL_TABLET | Freq: Two times a day (BID) | ORAL | 0 refills | Status: DC

## 2023-09-22 MED ORDER — PREDNISONE 20 MG PO TABS: 40.0000 mg | ORAL_TABLET | Freq: Every day | ORAL | 0 refills | Status: DC

## 2023-09-22 MED ORDER — FLUTICASONE PROPIONATE 50 MCG/ACT NA SUSP: 2.0000 | Freq: Every day | NASAL | 0 refills | Status: DC

## 2023-09-22 NOTE — ED Provider Notes (Signed)
 [facility]    CSN: [id number] Arrival date & time: 09/22/23  4098      History   Chief Complaint Chief Complaint  Patient presents with   Cough    HPI Rosarie Matuska is a 68 y.o. female.   HPI  Patient states she has a "sinus infection".  States she gets them every year.  States that she can tell that this is a sinus infection coming.  Wants to get it treated before it gets bad.  She has had sinus congestion with cough for 3 days.  Clear discharge.  No fever.  She is taking Advil Cold and Sinus.  It is giving her some relief.  She does have underlying environmental allergies.  No history of asthma.  Non-smoker, quit 1985  Past Medical History:  Diagnosis Date   Depression    GERD (gastroesophageal reflux disease)    Hypertension     Patient Active Problem List   Diagnosis Date Noted   Primary osteoarthritis of right knee 05/28/2023   Vasomotor instability 09/06/2022   Chronic right-sided low back pain without sciatica 08/08/2021   Impingement syndrome, shoulder, right 12/25/2019   Encounter for long-term (current) use of other medications 12/19/2019   GERD (gastroesophageal reflux disease) 12/19/2019   H/O seasonal allergies 12/19/2019   IFG (impaired fasting glucose) 12/19/2019   Migraine without status migrainosus, not intractable 12/19/2019   Vitamin D deficiency 12/19/2019   Irritable bowel syndrome with diarrhea 02/12/2019   Stress reaction 02/12/2019   Fatigue 01/29/2018   Skin lesion 01/29/2018   Major depressive disorder, recurrent, mild (HCC) 04/08/2017   Benign hematuria 05/08/2016   Essential hypertension with goal blood pressure less than 130/85 10/05/2014    Past Surgical History:  Procedure Laterality Date   ABLATION      OB History   No obstetric history on file.      Home Medications    Prior to Admission medications   Medication Sig Start Date End Date Taking? Authorizing Provider  amoxicillin-clavulanate (AUGMENTIN)  875-125 MG tablet Take 1 tablet by mouth every 12 (twelve) hours. 09/22/23  Yes Stephany Ehrich, MD  fluticasone (FLONASE) 50 MCG/ACT nasal spray Place 2 sprays into both nostrils daily. 09/22/23  Yes Stephany Ehrich, MD  predniSONE  (DELTASONE ) 20 MG tablet Take 2 tablets (40 mg total) by mouth daily with breakfast. 09/22/23  Yes Stephany Ehrich, MD  cyanocobalamin 1000 MCG tablet Take by mouth.    [provider]  escitalopram (LEXAPRO) 20 MG tablet Take 20 mg by mouth daily.    [provider]  famotidine (PEPCID) 20 MG tablet Take 20 mg by mouth 2 (two) times daily. 11/26/19   [provider]  nortriptyline (PAMELOR) 10 MG capsule Take 1 capsule by mouth at bedtime. 02/12/19   [provider]  potassium chloride (KLOR-CON) 10 MEQ tablet Take 10 mEq by mouth daily. 08/19/23   [provider]  SUMAtriptan (IMITREX) 50 MG tablet TAKE 1 TABLET BY MOUTH AT  ONSET OF HEADACHE, MAY  REPEAT IN 2 HOURS IF  NEEDED. MAXIMUM OF 2  TABLETS IN 24 HOURS 10/29/19   [provider]  traMADol  (ULTRAM ) 50 MG tablet Take 1 tablet (50 mg total) by mouth every 8 (eight) hours as needed for moderate pain (pain score 4-6). 08/26/23   Gean Keels, MD    Family History Family History  Problem Relation Age of Onset   Diabetes Mother    COPD Mother  Emphysema Father     Social History Social History   Tobacco Use   Smoking status: Former    Current packs/day: 0.00    Types: Cigarettes    Quit date: 02/25/1984    Years since quitting: 39.6   Smokeless tobacco: Never  Vaping Use   Vaping status: Never Used  Substance Use Topics   Alcohol use: No   Drug use: No     Allergies   Lisinopril   Review of Systems Review of Systems   Physical Exam Triage Vital Signs ED Triage Vitals  Encounter Vitals Group     BP 09/22/23 0849 139/81     Systolic BP Percentile --      Diastolic BP Percentile --      Pulse Rate 09/22/23 0849 100      Resp 09/22/23 0849 16     Temp 09/22/23 0849 98.9 F (37.2 C)     Temp src --      SpO2 09/22/23 0849 97 %     Weight --      Height --      Head Circumference --      Peak Flow --      Pain Score 09/22/23 0851 0     Pain Loc --      Pain Education --      Exclude from Growth Chart --    No data found.  Updated Vital Signs BP 139/81   Pulse 100   Temp 98.9 F (37.2 C)   Resp 16   SpO2 97%       Physical Exam Constitutional:      General: She is not in acute distress.    Appearance: She is well-developed and normal weight. She is ill-appearing.  HENT:     Head: Normocephalic and atraumatic.     Right Ear: Tympanic membrane normal.     Left Ear: Tympanic membrane normal.     Nose: Congestion and rhinorrhea present.     Comments: Facial sinuses are Non tender.  Clear rhinorrhea    Mouth/Throat:     Pharynx: Posterior oropharyngeal erythema present.  Eyes:     Conjunctiva/sclera: Conjunctivae normal.     Pupils: Pupils are equal, round, and reactive to light.  Cardiovascular:     Rate and Rhythm: Normal rate and regular rhythm.     Heart sounds: Normal heart sounds.  Pulmonary:     Effort: Pulmonary effort is normal. No respiratory distress.     Breath sounds: Normal breath sounds.  Abdominal:     General: There is no distension.     Palpations: Abdomen is soft.  Musculoskeletal:        General: Normal range of motion.     Cervical back: Normal range of motion and neck supple.  Lymphadenopathy:     Cervical: No cervical adenopathy.  Skin:    General: Skin is warm and dry.  Neurological:     Mental Status: She is alert.      UC Treatments / Results  Labs (all labs ordered are listed, but only abnormal results are displayed) Labs Reviewed - No data to display  EKG   Radiology No results found.  Procedures Procedures (including critical care time)  Medications Ordered in UC Medications - No data to display  Initial Impression / Assessment and  Plan / UC Course  I have reviewed the triage vital signs and the nursing notes.  Pertinent labs & imaging results that were available during my  care of the patient were reviewed by me and considered in my medical decision making (see chart for details).     At this time I explained that the patient had a respiratory virus, and combined with her environmental allergies and the pollen content is high. I am going to provide her with prednisone  and Flonase.  I am also going to recommend she take Augmentin if she fails to improve by day 7-10 Final Clinical Impressions(s) / UC Diagnoses   Final diagnoses:  Cough with congestion of paranasal sinus  Viral upper respiratory tract infection  Environmental and seasonal allergies     Discharge Instructions      Drink lots of water Flonase once a day Take the prednisone  once a day Continue your Zyrtec  If you fail to see improvement over the next couple days, or if your symptoms worsen I have prescribed Augmentin for you to take in case this seems like a bacterial sinus infection   ED Prescriptions     Medication Sig Dispense Auth. Provider   predniSONE  (DELTASONE ) 20 MG tablet Take 2 tablets (40 mg total) by mouth daily with breakfast. 10 tablet Stephany Ehrich, MD   fluticasone (FLONASE) 50 MCG/ACT nasal spray Place 2 sprays into both nostrils daily. 16 g Stephany Ehrich, MD   amoxicillin-clavulanate (AUGMENTIN) 875-125 MG tablet Take 1 tablet by mouth every 12 (twelve) hours. 14 tablet Stephany Ehrich, MD      PDMP not reviewed this encounter.   Stephany Ehrich, MD 09/22/23 [phone]

## 2023-09-22 NOTE — Discharge Instructions (Signed)
 Drink lots of water Flonase once a day Take the prednisone  once a day Continue your Zyrtec  If you fail to see improvement over the next couple days, or if your symptoms worsen I have prescribed Augmentin for you to take in case this seems like a bacterial sinus infection

## 2023-09-22 NOTE — ED Triage Notes (Signed)
 Sinus congestion and cough x 3 days. No fever. Has been taking advil cold and sinus.

## 2023-10-17 ENCOUNTER — Ambulatory Visit: Admitting: Sports Medicine

## 2023-10-17 ENCOUNTER — Other Ambulatory Visit (INDEPENDENT_AMBULATORY_CARE_PROVIDER_SITE_OTHER)

## 2023-10-17 DIAGNOSIS — M1711 Unilateral primary osteoarthritis, right knee: Secondary | ICD-10-CM | POA: Diagnosis not present

## 2023-10-17 MED ORDER — HYALURONAN 30 MG/2ML IX SOSY
15.0000 mg | PREFILLED_SYRINGE | Freq: Once | INTRA_ARTICULAR | Status: AC
  Administered 2023-10-17: 15 mg via INTRA_ARTICULAR

## 2023-10-17 NOTE — Addendum Note (Signed)
 Addended by: Montgomery Apgar on: 10/17/2023 10:16 AM   Modules accepted: Orders

## 2023-10-17 NOTE — Progress Notes (Signed)
    Procedures performed today:    Procedure: Real-time Ultrasound Guided injection of the right knee Device: Samsung HS60  Verbal informed consent obtained.  Time-out conducted.  Noted no overlying erythema, induration, or other signs of local infection.  Skin prepped in a sterile fashion.  Local anesthesia: Topical Ethyl chloride.  With sterile technique and under real time ultrasound guidance: 30 mg/2 mL of OrthoVisc (sodium hyaluronate) in a prefilled syringe was injected easily into the knee through a 22-gauge needle. Completed without difficulty  Advised to call if fevers/chills, erythema, induration, drainage, or persistent bleeding.  Images permanently stored and available for review in PACS.  Impression: Technically successful ultrasound guided injection.  Independent interpretation of notes and tests performed by another provider:   None.  Brief History, Exam, Impression, and Recommendations:    Primary osteoarthritis of right knee Known severe osteoarthritis, last injection steroid injection was at the end of March, today we did Orthovisc #1 of 4, return in 1 week for #2 of 4.    ____________________________________________ Joselyn Nicely. Sandy Crumb, M.D., ABFM., CAQSM., AME. Primary Care and Sports Medicine [facility]  Adjunct Professor of [facility] Medicine  [employer]  Restaurant manager, fast food

## 2023-10-17 NOTE — Assessment & Plan Note (Signed)
 Known severe osteoarthritis, last injection steroid injection was at the end of March, today we did Orthovisc #1 of 4, return in 1 week for #2 of 4.

## 2023-10-24 ENCOUNTER — Ambulatory Visit: Admitting: Sports Medicine

## 2023-10-24 ENCOUNTER — Other Ambulatory Visit (INDEPENDENT_AMBULATORY_CARE_PROVIDER_SITE_OTHER)

## 2023-10-24 ENCOUNTER — Encounter: Payer: Self-pay | Admitting: Sports Medicine

## 2023-10-24 DIAGNOSIS — M1711 Unilateral primary osteoarthritis, right knee: Secondary | ICD-10-CM

## 2023-10-24 MED ORDER — HYALURONAN 30 MG/2ML IX SOSY
15.0000 mg | PREFILLED_SYRINGE | Freq: Once | INTRA_ARTICULAR | Status: AC
Start: 2023-10-24 — End: 2023-10-24
  Administered 2023-10-24: 15 mg via INTRA_ARTICULAR

## 2023-10-24 NOTE — Progress Notes (Signed)
    Procedures performed today:    Procedure: Real-time Ultrasound Guided injection of the right knee Device: Samsung HS60  Verbal informed consent obtained.  Time-out conducted.  Noted no overlying erythema, induration, or other signs of local infection.  Skin prepped in a sterile fashion.  Local anesthesia: Topical Ethyl chloride.  With sterile technique and under real time ultrasound guidance: 30 mg/2 mL of OrthoVisc (sodium hyaluronate) in a prefilled syringe was injected easily into the knee through a 22-gauge needle. Completed without difficulty  Advised to call if fevers/chills, erythema, induration, drainage, or persistent bleeding.  Images permanently stored and available for review in PACS.  Impression: Technically successful ultrasound guided injection.  Independent interpretation of notes and tests performed by another provider:   None.  Brief History, Exam, Impression, and Recommendations:    Primary osteoarthritis of right knee Known severe osteoarthritis right knee, last steroid injection was the end of March, today we are doing Orthovisc 2 of 4, return in 1 week for #3 of 4.    ____________________________________________ Joselyn Nicely. Sandy Crumb, M.D., ABFM., CAQSM., AME. Primary Care and Sports Medicine [facility]  Adjunct Professor of [facility] Medicine  [employer]  Restaurant manager, fast food

## 2023-10-24 NOTE — Assessment & Plan Note (Signed)
 Known severe osteoarthritis right knee, last steroid injection was the end of March, today we are doing Orthovisc 2 of 4, return in 1 week for #3 of 4.

## 2023-10-28 ENCOUNTER — Encounter: Payer: Self-pay | Admitting: Sports Medicine

## 2023-10-28 ENCOUNTER — Other Ambulatory Visit: Payer: Self-pay | Admitting: Sports Medicine

## 2023-10-28 DIAGNOSIS — M1711 Unilateral primary osteoarthritis, right knee: Secondary | ICD-10-CM

## 2023-10-28 DIAGNOSIS — G8929 Other chronic pain: Secondary | ICD-10-CM

## 2023-10-28 MED ORDER — TRAMADOL HCL 50 MG PO TABS
50.0000 mg | ORAL_TABLET | Freq: Three times a day (TID) | ORAL | 0 refills | Status: DC | PRN
Start: 2023-10-28 — End: 2023-11-04

## 2023-10-28 NOTE — Telephone Encounter (Signed)
 Requesting rx rf of Tramadol  50mg  Last written 08/26/2023 Last OV 10/24/2023 Upcoming appt 10/31/2023

## 2023-10-31 ENCOUNTER — Ambulatory Visit: Admitting: Sports Medicine

## 2023-10-31 ENCOUNTER — Other Ambulatory Visit (INDEPENDENT_AMBULATORY_CARE_PROVIDER_SITE_OTHER)

## 2023-10-31 DIAGNOSIS — M1711 Unilateral primary osteoarthritis, right knee: Secondary | ICD-10-CM

## 2023-10-31 MED ORDER — HYALURONAN 30 MG/2ML IX SOSY
15.0000 mg | PREFILLED_SYRINGE | Freq: Once | INTRA_ARTICULAR | Status: AC
  Administered 2023-10-31: 15 mg via INTRA_ARTICULAR

## 2023-10-31 NOTE — Progress Notes (Signed)
    Procedures performed today:    Procedure: Real-time Ultrasound Guided injection of the right knee Device: Samsung HS60  Verbal informed consent obtained.  Time-out conducted.  Noted no overlying erythema, induration, or other signs of local infection.  Skin prepped in a sterile fashion.  Local anesthesia: Topical Ethyl chloride.  With sterile technique and under real time ultrasound guidance: 30 mg/2 mL of OrthoVisc (sodium hyaluronate) in a prefilled syringe was injected easily into the knee through a 22-gauge needle. Completed without difficulty  Advised to call if fevers/chills, erythema, induration, drainage, or persistent bleeding.  Images permanently stored and available for review in PACS.  Impression: Technically successful ultrasound guided injection.  Independent interpretation of notes and tests performed by another provider:   None.  Brief History, Exam, Impression, and Recommendations:    Primary osteoarthritis of right knee Orthovisc 3 of 4 right knee, left steroid injection was at the end of March. Still having some pain but she does have tramadol , she will experiment with the dose to see what works best. Return to see me for 4 of 4 right knee.    ____________________________________________ Joselyn Nicely. Sandy Crumb, M.D., ABFM., CAQSM., AME. Primary Care and Sports Medicine [facility]  Adjunct Professor of [facility] Medicine  [employer]  Restaurant manager, fast food

## 2023-10-31 NOTE — Assessment & Plan Note (Addendum)
 Orthovisc 3 of 4 right knee, left steroid injection was at the end of March. Still having some pain but she does have tramadol , she will experiment with the dose to see what works best. Return to see me for 4 of 4 right knee.

## 2023-11-04 ENCOUNTER — Encounter (INDEPENDENT_AMBULATORY_CARE_PROVIDER_SITE_OTHER): Payer: Self-pay | Admitting: Sports Medicine

## 2023-11-04 DIAGNOSIS — M1711 Unilateral primary osteoarthritis, right knee: Secondary | ICD-10-CM

## 2023-11-04 DIAGNOSIS — G8929 Other chronic pain: Secondary | ICD-10-CM

## 2023-11-04 MED ORDER — TRAMADOL HCL 50 MG PO TABS: 100.0000 mg | ORAL_TABLET | Freq: Two times a day (BID) | ORAL | 3 refills | Status: DC

## 2023-11-04 NOTE — Telephone Encounter (Signed)

## 2023-11-04 NOTE — Telephone Encounter (Signed)
 Patient requesting rx rf of tramadol  100mg   twice per day .  Please see note from patient to DR. Thekkekandam.

## 2023-11-07 ENCOUNTER — Encounter: Payer: Self-pay | Admitting: Sports Medicine

## 2023-11-07 ENCOUNTER — Ambulatory Visit: Admitting: Sports Medicine

## 2023-11-07 ENCOUNTER — Other Ambulatory Visit (INDEPENDENT_AMBULATORY_CARE_PROVIDER_SITE_OTHER)

## 2023-11-07 DIAGNOSIS — M1711 Unilateral primary osteoarthritis, right knee: Secondary | ICD-10-CM

## 2023-11-07 MED ORDER — HYALURONAN 30 MG/2ML IX SOSY
15.0000 mg | PREFILLED_SYRINGE | Freq: Once | INTRA_ARTICULAR | Status: AC
  Administered 2023-11-07: 15 mg via INTRA_ARTICULAR

## 2023-11-07 NOTE — Assessment & Plan Note (Signed)
 Orthovisc 4 of 4 today. Last steroid injection was at the end of March. Has some tramadol . Return to see me 6 weeks as needed.

## 2023-11-07 NOTE — Progress Notes (Signed)
    Procedures performed today:    Procedure: Real-time Ultrasound Guided injection of the right knee Device: Samsung HS60  Verbal informed consent obtained.  Time-out conducted.  Noted no overlying erythema, induration, or other signs of local infection.  Skin prepped in a sterile fashion.  Local anesthesia: Topical Ethyl chloride.  With sterile technique and under real time ultrasound guidance: 30 mg/2 mL of OrthoVisc (sodium hyaluronate) in a prefilled syringe was injected easily into the knee through a 22-gauge needle. Completed without difficulty  Advised to call if fevers/chills, erythema, induration, drainage, or persistent bleeding.  Images permanently stored and available for review in PACS.  Impression: Technically successful ultrasound guided injection.  Independent interpretation of notes and tests performed by another provider:   None.  Brief History, Exam, Impression, and Recommendations:    Primary osteoarthritis of right knee Orthovisc 4 of 4 today. Last steroid injection was at the end of March. Has some tramadol . Return to see me 6 weeks as needed.    ____________________________________________ Joselyn Nicely. Sandy Crumb, M.D., ABFM., CAQSM., AME. Primary Care and Sports Medicine [facility]  Adjunct Professor of [facility] Medicine  [employer]  Restaurant manager, fast food

## 2023-12-19 ENCOUNTER — Ambulatory Visit (INDEPENDENT_AMBULATORY_CARE_PROVIDER_SITE_OTHER): Admitting: Sports Medicine

## 2023-12-19 DIAGNOSIS — M1711 Unilateral primary osteoarthritis, right knee: Secondary | ICD-10-CM | POA: Diagnosis not present

## 2023-12-19 NOTE — Progress Notes (Signed)
    Procedures performed today:    None.  Independent interpretation of notes and tests performed by another provider:   None.  Brief History, Exam, Impression, and Recommendations:    Primary osteoarthritis of right knee This is a very pleasant 68 year old female, we finished her series of Orthovisc mid June, she returns today for the most part pain-free, she does have some tramadol  for breakthrough pain. She does get occasional twinges but understands that this is a realistic expectation. She will continue her knee sleeve and home physical therapy indefinitely. She can return to see me as needed.    ____________________________________________ Debby PARAS. Curtis, M.D., ABFM., CAQSM., AME. Primary Care and Sports Medicine [facility]  Adjunct Professor of [facility] Medicine  [employer]  Restaurant manager, fast food

## 2023-12-19 NOTE — Assessment & Plan Note (Addendum)
 This is a very pleasant 68 year old female, we finished her series of Orthovisc mid June, she returns today for the most part pain-free, she does have some tramadol  for breakthrough pain. She does get occasional twinges but understands that this is a realistic expectation. She will continue her knee sleeve and home physical therapy indefinitely. She can return to see me as needed.

## 2024-01-28 ENCOUNTER — Encounter: Payer: Self-pay | Admitting: Sports Medicine

## 2024-05-30 ENCOUNTER — Other Ambulatory Visit: Payer: Self-pay

## 2024-05-30 ENCOUNTER — Ambulatory Visit
Admission: RE | Admit: 2024-05-30 | Discharge: 2024-05-30 | Disposition: A | Discharge status: Home / Self Care | Attending: Family Medicine | Admitting: Family Medicine

## 2024-05-30 VITALS — BP 137/82 | HR 116 | Temp 98.2°F | Resp 20 | Ht 64.0 in | Wt 158.0 lb

## 2024-05-30 DIAGNOSIS — N3001 Acute cystitis with hematuria: Secondary | ICD-10-CM | POA: Diagnosis not present

## 2024-05-30 LAB — POCT URINALYSIS DIP (MANUAL ENTRY)
Glucose, UA: 100 mg/dL — AB
Nitrite, UA: POSITIVE — AB
Protein Ur, POC: >=300 mg/dL — AB
Spec Grav, UA: 1.025
Urobilinogen, UA: 4 U/dL — AB
pH, UA: 5

## 2024-05-30 MED ORDER — CEFDINIR 300 MG PO CAPS: 300.0000 mg | ORAL_CAPSULE | Freq: Two times a day (BID) | ORAL | 0 refills | Status: AC

## 2024-05-30 NOTE — ED Provider Notes (Signed)
 " [facility]    CSN: [id number] Arrival date & time: 05/30/24  0802      History   Chief Complaint Chief Complaint  Patient presents with   Vaginal Bleeding    I have been having trouble urinating and I am bleeding. I'm 68 and don't understand why I am bleeding. - Entered by patient    HPI Lindsey Trujillo is a 69 y.o. female.   HPI  Past Medical History:  Diagnosis Date   Depression    GERD (gastroesophageal reflux disease)    Hypertension     Patient Active Problem List   Diagnosis Date Noted   Primary osteoarthritis of right knee 05/28/2023   Vasomotor instability 09/06/2022   Chronic right-sided low back pain without sciatica 08/08/2021   Impingement syndrome, shoulder, right 12/25/2019   Encounter for long-term (current) use of other medications 12/19/2019   GERD (gastroesophageal reflux disease) 12/19/2019   H/O seasonal allergies 12/19/2019   IFG (impaired fasting glucose) 12/19/2019   Migraine without status migrainosus, not intractable 12/19/2019   Vitamin D deficiency 12/19/2019   Irritable bowel syndrome with diarrhea 02/12/2019   Stress reaction 02/12/2019   Fatigue 01/29/2018   Skin lesion 01/29/2018   Major depressive disorder, recurrent, mild 04/08/2017   Benign hematuria 05/08/2016   Essential hypertension with goal blood pressure less than 130/85 10/05/2014    Past Surgical History:  Procedure Laterality Date   ABLATION      OB History   No obstetric history on file.      Home Medications    Prior to Admission medications  Medication Sig Start Date End Date Taking? Authorizing Provider  cefdinir  (OMNICEF ) 300 MG capsule Take 1 capsule (300 mg total) by mouth 2 (two) times daily for 7 days. 05/30/24 06/06/24 Yes Teddy Sharper, FNP  cyanocobalamin 1000 MCG tablet Take by mouth.    [provider]  escitalopram (LEXAPRO) 20 MG tablet Take 20 mg by mouth daily.    [provider]  famotidine (PEPCID) 20 MG tablet  Take 20 mg by mouth 2 (two) times daily. 11/26/19   [provider]  nortriptyline (PAMELOR) 10 MG capsule Take 1 capsule by mouth at bedtime. 02/12/19   [provider]  potassium chloride (KLOR-CON) 10 MEQ tablet Take 10 mEq by mouth daily. 08/19/23   [provider]  SUMAtriptan (IMITREX) 50 MG tablet TAKE 1 TABLET BY MOUTH AT  ONSET OF HEADACHE, MAY  REPEAT IN 2 HOURS IF  NEEDED. MAXIMUM OF 2  TABLETS IN 24 HOURS 10/29/19   [provider]    Family History Family History  Problem Relation Age of Onset   Diabetes Mother    COPD Mother    Emphysema Father     Social History Social History[1]   Allergies   Lisinopril   Review of Systems Review of Systems  Genitourinary:  Positive for vaginal bleeding.     Physical Exam Triage Vital Signs ED Triage Vitals  Encounter Vitals Group     BP 05/30/24 0822 137/82     Girls Systolic BP Percentile --      Girls Diastolic BP Percentile --      Boys Systolic BP Percentile --      Boys Diastolic BP Percentile --      Pulse Rate 05/30/24 0822 (!) 116     Resp 05/30/24 0822 20     Temp 05/30/24 0822 98.2 F (36.8 C)     Temp Source 05/30/24 9177 Oral  SpO2 05/30/24 0822 97 %     Weight 05/30/24 0825 158 lb (71.7 kg)     Height 05/30/24 0825 5' 4 (1.626 m)     Head Circumference --      Peak Flow --      Pain Score 05/30/24 0825 4     Pain Loc --      Pain Education --      Exclude from Growth Chart --    No data found.  Updated Vital Signs BP 137/82 (BP Location: Right Arm)   Pulse (!) 116   Temp 98.2 F (36.8 C) (Oral)   Resp 20   Ht 5' 4 (1.626 m)   Wt 158 lb (71.7 kg)   SpO2 97%   BMI 27.12 kg/m   Visual Acuity Right Eye Distance:   Left Eye Distance:   Bilateral Distance:    Right Eye Near:   Left Eye Near:    Bilateral Near:     Physical Exam Vitals and nursing note reviewed.  Constitutional:      Appearance: Normal appearance. She is normal weight.  HENT:      Head: Normocephalic and atraumatic.     Mouth/Throat:     Mouth: Mucous membranes are moist.     Pharynx: Oropharynx is clear.  Eyes:     Extraocular Movements: Extraocular movements intact.     Conjunctiva/sclera: Conjunctivae normal.     Pupils: Pupils are equal, round, and reactive to light.  Cardiovascular:     Rate and Rhythm: Normal rate and regular rhythm.     Heart sounds: Normal heart sounds.  Pulmonary:     Effort: Pulmonary effort is normal.     Breath sounds: Normal breath sounds. No wheezing, rhonchi or rales.  Abdominal:     Tenderness: There is no right CVA tenderness or left CVA tenderness.  Musculoskeletal:        General: Normal range of motion.     Cervical back: Normal range of motion and neck supple.  Skin:    General: Skin is warm and dry.  Neurological:     General: No focal deficit present.     Mental Status: She is alert and oriented to person, place, and time.  Psychiatric:        Mood and Affect: Mood normal.        Behavior: Behavior normal.      UC Treatments / Results  Labs (all labs ordered are listed, but only abnormal results are displayed) Labs Reviewed  POCT URINALYSIS DIP (MANUAL ENTRY) - Abnormal; Notable for the following components:      Result Value   Color, UA orange (*)    Clarity, UA hazy (*)    Glucose, UA =100 (*)    Bilirubin, UA large (*)    Ketones, POC UA small (15) (*)    Blood, UA large (*)    Protein Ur, POC >=300 (*)    Urobilinogen, UA 4.0 (*)    Nitrite, UA Positive (*)    Leukocytes, UA Large (3+) (*)    All other components within normal limits  URINE CULTURE    EKG   Radiology No results found.  Procedures Procedures (including critical care time)  Medications Ordered in UC Medications - No data to display  Initial Impression / Assessment and Plan / UC Course  I have reviewed the triage vital signs and the nursing notes.  Pertinent labs & imaging results that were available during my care of  the patient were reviewed by me and considered in my medical decision making (see chart for details).     MDM: 1.  Acute cystitis with hematuria-UA revealed above, urine culture ordered, Rx'd cefdinir  300 mg capsule: Take 1 capsule twice daily x 7 days. Advised patient take medication as directed with food to completion.  Encouraged increase daily water intake to 64 ounces per day while taking this medication.  Advised we will follow-up with urine culture results once received.  Advised if symptoms worsen and/or unresolved please follow-up with your PCP, [facility] urology, here, or go to nearest ED. patient discharged home, hemodynamically stable Final Clinical Impressions(s) / UC Diagnoses   Final diagnoses:  Acute cystitis with hematuria     Discharge Instructions      Advised patient take medication as directed with food to completion.  Encouraged increase daily water intake to 64 ounces per day while taking this medication.  Advised we will follow-up with urine culture results once received.  Advised if symptoms worsen and/or unresolved please follow-up with your PCP, [facility] urology, here, or go to nearest ED.     ED Prescriptions     Medication Sig Dispense Auth. Provider   cefdinir  (OMNICEF ) 300 MG capsule Take 1 capsule (300 mg total) by mouth 2 (two) times daily for 7 days. 14 capsule Ilynn Stauffer, FNP      PDMP not reviewed this encounter.    [1]  Social History Tobacco Use   Smoking status: Former    Current packs/day: 0.00    Types: Cigarettes    Quit date: 02/25/1984    Years since quitting: 40.2   Smokeless tobacco: Never  Vaping Use   Vaping status: Never Used  Substance Use Topics   Alcohol use: No   Drug use: No     Teddy Sharper, FNP 05/30/24 [phone]  "

## 2024-05-30 NOTE — Discharge Instructions (Addendum)
 Advised patient take medication as directed with food to completion.  Encouraged increase daily water intake to 64 ounces per day while taking this medication.  Advised we will follow-up with urine culture results once received.  Advised if symptoms worsen and/or unresolved please follow-up with your PCP, [facility] urology, here, or go to nearest ED.

## 2024-05-30 NOTE — ED Triage Notes (Signed)
 Pt presenting with c/o vaginal bleeding and pain on the left side of her abdomen  x 1 day. Pt stated she has been having burning on urination which has now improved. Pt stated that she took an AZO x 2 which were effective.

## 2024-05-31 LAB — URINE CULTURE: Culture: <10000 — AB

## 2024-06-01 ENCOUNTER — Ambulatory Visit (HOSPITAL_COMMUNITY): Payer: Self-pay
